# Patient Record
Sex: Female | Born: 1955 | Race: White | Hispanic: No | Marital: Married | Smoking: Never smoker
Health system: Southern US, Community
[De-identification: ages and names within clinical notes are randomized; demographics above are authoritative.]

## PROBLEM LIST (undated history)

## (undated) DIAGNOSIS — J302 Other seasonal allergic rhinitis: Secondary | ICD-10-CM

## (undated) DIAGNOSIS — T148XXA Other injury of unspecified body region, initial encounter: Secondary | ICD-10-CM

## (undated) DIAGNOSIS — I10 Essential (primary) hypertension: Secondary | ICD-10-CM

## (undated) DIAGNOSIS — F32A Depression, unspecified: Secondary | ICD-10-CM

## (undated) DIAGNOSIS — C50919 Malignant neoplasm of unspecified site of unspecified female breast: Secondary | ICD-10-CM

## (undated) DIAGNOSIS — Z9289 Personal history of other medical treatment: Secondary | ICD-10-CM

## (undated) DIAGNOSIS — R011 Cardiac murmur, unspecified: Secondary | ICD-10-CM

## (undated) DIAGNOSIS — L24A9 Irritant contact dermatitis due friction or contact with other specified body fluids: Secondary | ICD-10-CM

## (undated) DIAGNOSIS — I447 Left bundle-branch block, unspecified: Secondary | ICD-10-CM

## (undated) DIAGNOSIS — F329 Major depressive disorder, single episode, unspecified: Secondary | ICD-10-CM

## (undated) DIAGNOSIS — R0683 Snoring: Secondary | ICD-10-CM

## (undated) DIAGNOSIS — M1990 Unspecified osteoarthritis, unspecified site: Secondary | ICD-10-CM

## (undated) DIAGNOSIS — M797 Fibromyalgia: Secondary | ICD-10-CM

## (undated) DIAGNOSIS — E119 Type 2 diabetes mellitus without complications: Secondary | ICD-10-CM

## (undated) HISTORY — DX: Cardiac murmur, unspecified: R01.1

## (undated) HISTORY — PX: MOUTH SURGERY: SHX715

## (undated) HISTORY — PX: DILATION AND CURETTAGE OF UTERUS: SHX78

## (undated) HISTORY — DX: Left bundle-branch block, unspecified: I44.7

## (undated) HISTORY — PX: TONSILLECTOMY: SUR1361

## (undated) HISTORY — PX: ABDOMINAL HYSTERECTOMY: SHX81

---

## 2013-12-15 ENCOUNTER — Ambulatory Visit: Payer: Self-pay | Admitting: Endocrinology

## 2016-06-18 ENCOUNTER — Other Ambulatory Visit: Payer: Self-pay | Admitting: Surgical Oncology

## 2016-06-18 DIAGNOSIS — N6489 Other specified disorders of breast: Secondary | ICD-10-CM

## 2016-06-22 ENCOUNTER — Other Ambulatory Visit: Payer: Self-pay | Admitting: Surgical Oncology

## 2016-06-22 DIAGNOSIS — N6489 Other specified disorders of breast: Secondary | ICD-10-CM

## 2016-06-22 DIAGNOSIS — R599 Enlarged lymph nodes, unspecified: Secondary | ICD-10-CM

## 2016-06-25 ENCOUNTER — Inpatient Hospital Stay: Admission: RE | Admit: 2016-06-25 | Payer: Self-pay | Source: Ambulatory Visit

## 2016-06-25 ENCOUNTER — Ambulatory Visit
Admission: RE | Admit: 2016-06-25 | Discharge: 2016-06-25 | Disposition: A | Discharge status: Home / Self Care | Payer: BLUE CROSS/BLUE SHIELD | Source: Ambulatory Visit | Attending: Surgical Oncology | Admitting: Surgical Oncology

## 2016-06-25 DIAGNOSIS — N6489 Other specified disorders of breast: Secondary | ICD-10-CM

## 2016-06-25 DIAGNOSIS — R599 Enlarged lymph nodes, unspecified: Secondary | ICD-10-CM

## 2016-06-28 ENCOUNTER — Telehealth: Payer: Self-pay | Admitting: Hematology and Oncology

## 2016-06-28 ENCOUNTER — Encounter: Payer: Self-pay | Admitting: Hematology and Oncology

## 2016-06-28 NOTE — Telephone Encounter (Signed)
Appt scheduled with Dr. Lindi Adie on 9/25 at 815. Patient aware to arrive at 8am.Demogrpahics verfied. Letter mailed to the patient. Unable to give the patient the address.  I will have one of the ladies in HIM call and give her the address.

## 2016-06-28 NOTE — Telephone Encounter (Signed)
Pt called back. Gave the address to our facility.

## 2016-07-02 ENCOUNTER — Telehealth: Payer: Self-pay | Admitting: *Deleted

## 2016-07-02 ENCOUNTER — Ambulatory Visit (HOSPITAL_BASED_OUTPATIENT_CLINIC_OR_DEPARTMENT_OTHER): Payer: BLUE CROSS/BLUE SHIELD | Admitting: Hematology and Oncology

## 2016-07-02 ENCOUNTER — Encounter: Payer: Self-pay | Admitting: Hematology and Oncology

## 2016-07-02 ENCOUNTER — Encounter: Payer: Self-pay | Admitting: *Deleted

## 2016-07-02 ENCOUNTER — Other Ambulatory Visit: Payer: Self-pay | Admitting: Hematology and Oncology

## 2016-07-02 DIAGNOSIS — C50211 Malignant neoplasm of upper-inner quadrant of right female breast: Secondary | ICD-10-CM | POA: Diagnosis not present

## 2016-07-02 MED ORDER — ANASTROZOLE 1 MG PO TABS: 1.0000 mg | ORAL_TABLET | Freq: Every day | ORAL | 3 refills | Status: AC

## 2016-07-02 NOTE — Telephone Encounter (Signed)
Received order per Dr. Gudena for Mammaprint testing. Requisition sent to pathology and [facility]. Received by Keisha 

## 2016-07-02 NOTE — Progress Notes (Signed)
[facility] [place] NOTE  Patient Care Team: Verdell Carmine, MD as PCP - General (Family Medicine)  CHIEF COMPLAINTS/PURPOSE OF CONSULTATION:  Newly diagnosed breast cancer  HISTORY OF PRESENTING ILLNESS:  Sandra Compton 60 y.o. female is here because of recent diagnosis of right breast cancer. She had a routine screening mammogram that revealed an abnormality in the right breast measuring 1.5 cm in diameter. Original mammogram was done at [facility]. There was also a 3 cm right axillary lymph node. She subsequently underwent additional mammograms ultrasounds and a biopsy of both the breast mass in the lymph node. 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 The lymph node biopsy was also positive for breast cancer.   I reviewed her records extensively and collaborated the history with the patient.  SUMMARY OF ONCOLOGIC HISTORY:   Breast cancer of upper-inner quadrant of right female breast ([place])   06/25/2016 Initial Diagnosis    Right breast biopsy 7 cmfn: Invasive ductal carcinoma, ER 100%, PR 80%, Ki-67 50%, HER-2 negative ratio 1.07, right axillary lymph node biopsy positive for invasive ductal carcinoma, mammogram at [facility] revealed 1.5 cm mass to 3 cm axillary lymph node.       MEDICAL HISTORY:  Diabetes mellitus, hypertension  SURGICAL HISTORY: Hysterectomy, oral surgery, tonsillectomy  SOCIAL HISTORY: Denies any tobacco: Immigration drug use. 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  FAMILY HISTORY: Patient has family history of August and paternal grandmother with breast cancers  ALLERGIES:  has no allergies on file.  MEDICATIONS: triamterene hydrochlorothiazide 30 7. 5-25 milligrams, sertraline 100 mg daily, glyburide-metformin 5-500 milligrams daily  Current Outpatient Prescriptions  Medication Sig Dispense Refill  . anastrozole (ARIMIDEX)  1 MG tablet Take 1 tablet (1 mg total) by mouth daily. 90 tablet 3   No current facility-administered medications for this visit.     REVIEW OF SYSTEMS:   Constitutional: Denies fevers, chills or abnormal night sweats Eyes: Denies blurriness of vision, double vision or watery eyes Ears, nose, mouth, throat, and face: Denies mucositis or sore throat Respiratory: Denies cough, dyspnea or wheezes Cardiovascular: Denies palpitation, chest discomfort or lower extremity swelling Gastrointestinal:  Denies nausea, heartburn or change in bowel habits Skin: Denies abnormal skin rashes Lymphatics: Denies new lymphadenopathy or easy bruising Neurological:Denies numbness, tingling or new weaknesses Behavioral/Psych: Mood is stable, no new changes  Breast:  Denies any palpable lumps or discharge All other systems were reviewed with the patient and are negative.  PHYSICAL EXAMINATION: ECOG PERFORMANCE STATUS: 0 - Asymptomatic  Vitals:   07/02/16 0855  BP: (!) 159/77  Pulse: 86  Resp: 18  Temp: 98.8 F (37.1 C)   Filed Weights   07/02/16 0855  Weight: 186 lb 9.6 oz (84.6 kg)    GENERAL:alert, no distress and comfortable SKIN: skin color, texture, turgor are normal, no rashes or significant lesions EYES: normal, conjunctiva are pink and non-injected, sclera clear OROPHARYNX:no exudate, no erythema and lips, buccal mucosa, and tongue normal  NECK: supple, thyroid normal size, non-tender, without nodularity LYMPH:  no palpable lymphadenopathy in the cervical, axillary or inguinal LUNGS: clear to auscultation and percussion with normal breathing effort HEART: regular rate & rhythm and no murmurs and no lower extremity edema ABDOMEN:abdomen soft, non-tender and normal bowel sounds Musculoskeletal:no cyanosis of digits and no clubbing  PSYCH: alert & oriented x 3 with fluent speech NEURO: no focal motor/sensory deficits BREAST: No  palpable nodules in breast. No palpable axillary or  supraclavicular lymphadenopathy (exam performed in the presence of a chaperone)   RADIOGRAPHIC STUDIES: I have personally reviewed the radiological reports and agreed with the findings in the report.  ASSESSMENT AND PLAN:  Breast cancer of upper-inner quadrant of right female breast ([place]) Right breast biopsy 7 cmfn 06/25/2016: Invasive ductal carcinoma, ER 100%, PR 80%, Ki-67 50%, HER-2 negative ratio 1.07, right axillary lymph node biopsy positive for invasive ductal carcinoma, mammogram at [facility] revealed 1.5 cm mass to 3 cm axillary lymph node.  Pathology and radiology counseling: Discussed with the patient, the details of pathology including the type of breast cancer,the clinical staging, the significance of ER, PR and HER-2/neu receptors and the implications for treatment. After reviewing the pathology in detail, we proceeded to discuss the different treatment options between surgery, radiation, chemotherapy, antiestrogen therapies.  Recommendation: 1. Mammoprint testing on the biopsy to determine if she would benefit from systemic chemotherapy 2. start neoadjuvant therapy with anastrozole today 3. If the mammogram testing reveals high risk, we will then switch her from hormonal therapy to chemotherapy. 4. breast conserving surgery with sentinel lymph node evaluation 5. Adjuvant therapy based upon Mammaprint test results 6. Followed by adjuvant radiation 7. Followed by adjuvant antiestrogen therapy (with further consideration for PALLAS clinical trial)  Mammaprint counseling: MINDACT is a prospective, randomized phase III controlled trial that investigates the clinical utility of MammaPrint, when compared to standard clinical pathological criteria, with 6,693 patients enrolled from over 111 institutions. Clinical high-risk patients with a Low Risk MammaPrint result, including 48% node-positive, had 5-year distant metastasis-free survival rate in excess of 94 percent, whether randomized  to receive adjuvant chemotherapy or not proving MammaPrint's ability to safely identify Low Risk patients.  Anastrozole counseling: We discussed the risks and benefits of anti-estrogen therapy with aromatase inhibitors. These include but not limited to insomnia, hot flashes, mood changes, vaginal dryness, bone density loss, and weight gain. We strongly believe that the benefits far outweigh the risks. Patient understands these risks and consented to starting treatment.  We will present her case in the tumor board.   All questions were answered. The patient knows to call the clinic with any problems, questions or concerns.    Rulon Eisenmenger, MD 07/02/16

## 2016-07-02 NOTE — Assessment & Plan Note (Signed)
Right breast biopsy 7 cmfn 06/25/2016: Invasive ductal carcinoma, ER 100%, PR 80%, Ki-67 50%, HER-2 negative ratio 1.07, right axillary lymph node biopsy positive for invasive ductal carcinoma, mammogram at [facility] revealed 1.5 cm mass to 3 cm axillary lymph node.  Pathology and radiology counseling: Discussed with the patient, the details of pathology including the type of breast cancer,the clinical staging, the significance of ER, PR and HER-2/neu receptors and the implications for treatment. After reviewing the pathology in detail, we proceeded to discuss the different treatment options between surgery, radiation, chemotherapy, antiestrogen therapies.  Recommendation: 1. Mammoprint testing on the biopsy to determine if she would benefit from systemic chemotherapy 2. start neoadjuvant therapy with anastrozole today 3. If the mammogram testing reveals high risk, we will then switch her from hormonal therapy to chemotherapy. 4. breast conserving surgery with sentinel lymph node evaluation 5. Adjuvant therapy based upon Mammaprint test results 6. Followed by adjuvant radiation 7. Followed by adjuvant antiestrogen therapy (with further consideration for PALLAS clinical trial)  Anastrozole counseling: We discussed the risks and benefits of anti-estrogen therapy with aromatase inhibitors. These include but not limited to insomnia, hot flashes, mood changes, vaginal dryness, bone density loss, and weight gain. We strongly believe that the benefits far outweigh the risks. Patient understands these risks and consented to starting treatment.  We will present her case in the tumor board.

## 2016-07-09 ENCOUNTER — Encounter: Payer: Self-pay | Admitting: Radiation Oncology

## 2016-07-09 ENCOUNTER — Ambulatory Visit
Admission: RE | Admit: 2016-07-09 | Discharge: 2016-07-09 | Disposition: A | Discharge status: Home / Self Care | Payer: BLUE CROSS/BLUE SHIELD | Source: Ambulatory Visit | Attending: Radiation Oncology | Admitting: Radiation Oncology

## 2016-07-09 ENCOUNTER — Encounter (HOSPITAL_COMMUNITY): Payer: Self-pay

## 2016-07-09 ENCOUNTER — Ambulatory Visit (HOSPITAL_COMMUNITY)
Admission: RE | Admit: 2016-07-09 | Discharge: 2016-07-09 | Disposition: A | Discharge status: Home / Self Care | Payer: BLUE CROSS/BLUE SHIELD | Source: Ambulatory Visit | Attending: Hematology and Oncology | Admitting: Hematology and Oncology

## 2016-07-09 VITALS — BP 179/94 | HR 92 | Resp 16 | Ht 62.0 in | Wt 189.6 lb

## 2016-07-09 DIAGNOSIS — I10 Essential (primary) hypertension: Secondary | ICD-10-CM | POA: Diagnosis not present

## 2016-07-09 DIAGNOSIS — Z888 Allergy status to other drugs, medicaments and biological substances status: Secondary | ICD-10-CM | POA: Insufficient documentation

## 2016-07-09 DIAGNOSIS — C50211 Malignant neoplasm of upper-inner quadrant of right female breast: Secondary | ICD-10-CM | POA: Insufficient documentation

## 2016-07-09 DIAGNOSIS — C773 Secondary and unspecified malignant neoplasm of axilla and upper limb lymph nodes: Secondary | ICD-10-CM | POA: Insufficient documentation

## 2016-07-09 DIAGNOSIS — Z79899 Other long term (current) drug therapy: Secondary | ICD-10-CM | POA: Insufficient documentation

## 2016-07-09 DIAGNOSIS — Z880 Allergy status to penicillin: Secondary | ICD-10-CM | POA: Insufficient documentation

## 2016-07-09 DIAGNOSIS — Z803 Family history of malignant neoplasm of breast: Secondary | ICD-10-CM | POA: Diagnosis not present

## 2016-07-09 DIAGNOSIS — Z170 Estrogen receptor positive status [ER+]: Principal | ICD-10-CM

## 2016-07-09 DIAGNOSIS — E119 Type 2 diabetes mellitus without complications: Secondary | ICD-10-CM | POA: Insufficient documentation

## 2016-07-09 DIAGNOSIS — Z9889 Other specified postprocedural states: Secondary | ICD-10-CM | POA: Diagnosis not present

## 2016-07-09 HISTORY — DX: Essential (primary) hypertension: I10

## 2016-07-09 HISTORY — DX: Type 2 diabetes mellitus without complications: E11.9

## 2016-07-09 HISTORY — DX: Malignant neoplasm of unspecified site of unspecified female breast: C50.919

## 2016-07-09 LAB — POCT I-STAT CREATININE: CREATININE: 0.8 mg/dL (ref 0.44–1.00)

## 2016-07-09 MED ORDER — GADOBENATE DIMEGLUMINE 529 MG/ML IV SOLN
18.0000 mL | Freq: Once | INTRAVENOUS | Status: AC | PRN
  Administered 2016-07-09: 18 mL via INTRAVENOUS

## 2016-07-09 NOTE — Progress Notes (Signed)
Location of Breast Cancer: Upper inner quadrant of right breast  Histology per Pathology Report:    Receptor Status: ER(100%), PR (80%), Her2-neu (-), Ki-(50%)  Did patient present with symptoms (if so, please note symptoms) or was this found on screening mammography?: found on screening mammogram  Past/Anticipated interventions by surgeon, if LTE:IHDTPN conserving surgery with sentinel lymph node evaluation recommended  Past/Anticipated interventions by medical oncology, if any: Gudena recommending mammoprint testing on biopsy, started patient on neoadjuvant therapy with anastrozole, hormonal therapy would be switched to chemotherapy dependent upon mammoprint results then, adjuvant antiestrogen therapy  Lymphedema issues, if any:  no    Pain issues, if any: none    SAFETY ISSUES:  Prior radiation? no  Pacemaker/ICD? no  Possible current pregnancy?no  Is the patient on methotrexate? no  Current Complaints / other details:  60 year old female. Married. Follows up with Gudena on Monday for mammoprint results. Paternal aunt and paternal great aunt had breast ca.    Joaquim Lai, RN 07/09/2016,8:26 AM

## 2016-07-09 NOTE — Progress Notes (Signed)
Radiation Oncology         [phone] ________________________________  Initial Outpatient Consultation  Name: Sandra Compton MRN: [id number]  Date: 07/09/2016  DOB: 1956/10/02  CC:Pcp Not In System  Excell Seltzer, MD   REFERRING PHYSICIAN: Excell Seltzer, MD  DIAGNOSIS: 60 y.o. 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 - Clinical stage IIA    ICD-9-CM ICD-10-CM   1. Malignant neoplasm of upper-inner quadrant of right breast in female, estrogen receptor positive ([place]) 174.2 C50.211    V86.0 Z17.0     HISTORY OF PRESENT ILLNESS: Sandra Compton is a 60 y.o. female seen at the request of Dr. Lindi Adie for a new diagnosis of right breast cancer. The patient had a screening mammogram at [facility] on 06/04/2016 that revealed an abnormality in the right breast. She returned for further imaging at [facility] on 06/07/2016 where she was found to have a mass measuring 1.5 cm in diameter at 1:30 position. There was also a 3 cm right axillary lymph node. She subsequently underwent diagnostic imaging and a biopsy of both the breast mass in the lymph node. 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 The lymph node biopsy was also positive for breast cancer. Her biopsy has been submitted for mammoprint, and she was started on anastrazole. She has plans to also undergo lumpectomy with sentinel evaluation. Mammaprint reveals she is low risk. She comes today to discuss the role of radiotherapy following surgery, and after systemic treatment if indicated. Of note, history of breast cancer in paternal aunt, and paternal great-aunt.  Patient denies lymphedema issues, or pain at this time.  PREVIOUS RADIATION THERAPY: No  PAST MEDICAL HISTORY:  Past Medical History:  Diagnosis Date  . Breast cancer ([place])   . Diabetes mellitus without complication ([place])   . Hypertension        PAST SURGICAL HISTORY: Past Surgical History:  Procedure Laterality Date  . ABDOMINAL HYSTERECTOMY    . MOUTH SURGERY    . TONSILLECTOMY      FAMILY HISTORY:  Family History  Problem Relation Age of Onset  . Cancer Paternal Aunt     breast  . Cancer Other     breast    SOCIAL HISTORY:  Social History   Social History  . Marital status: Married    Spouse name: N/A  . Number of children: N/A  . Years of education: N/A   Occupational History  . Not on file.   Social History Main Topics  . Smoking status: Never Smoker  . Smokeless tobacco: Never Used  . Alcohol use No  . Drug use: No  . Sexual activity: Yes   Other Topics Concern  . Not on file   Social History Narrative  . No narrative on file    ALLERGIES: Codeine and Penicillins  MEDICATIONS:  Current Outpatient Prescriptions  Medication Sig Dispense Refill  . anastrozole (ARIMIDEX) 1 MG tablet Take 1 tablet (1 mg total) by mouth daily. 90 tablet 3  . dapagliflozin propanediol (FARXIGA) 5 MG TABS tablet Take 5 mg by mouth daily.    Marland Kitchen glyBURIDE-metformin (GLUCOVANCE) 5-500 MG tablet   0  . sertraline (ZOLOFT) 100 MG tablet   0  . triamterene-hydrochlorothiazide (MAXZIDE-25) 37.5-25 MG tablet   0   No current facility-administered medications for this encounter.     REVIEW OF SYSTEMS:  On review of systems, the patient  reports that she is doing well overall. She denies any chest pain, shortness of breath, cough, fevers, chills, night sweats, unintended weight changes. She denies any bowel or bladder disturbances, and denies abdominal pain, nausea or vomiting. She denies any new musculoskeletal or joint aches or pains. A complete review of systems is obtained and is otherwise negative.    PHYSICAL EXAM:  height is 5' 2"  (1.575 m) and weight is 189 lb 9.6 oz (86 kg). Her blood pressure is 179/94 (abnormal) and her pulse is 92. Her respiration is 16 and oxygen saturation is 100%.   Pain Scale 0/10 In  general this is a well appearing caucasian woman in no acute distress. She is alert and oriented x4 and appropriate throughout the examination. HEENT reveals that the patient is normocephalic, atraumatic. EOMs are intact. PERRLA. Skin is intact without any evidence of gross lesions. Cardiovascular exam reveals a regular rate and rhythm, no clicks rubs or murmurs are auscultated. Chest is clear to auscultation bilaterally. Lymphatic assessment is performed and does not reveal any adenopathy in the cervical, supraclavicular, axillary, or inguinal chains. Abdomen has active bowel sounds in all quadrants and is intact. The abdomen is soft, non tender, non distended. Lower extremities are negative for pretibial pitting edema, deep calf tenderness, cyanosis or clubbing.    KPS = 100  100 - Normal; no complaints; no evidence of disease. 90   - Able to carry on normal activity; minor signs or symptoms of disease. 80   - Normal activity with effort; some signs or symptoms of disease. 63   - Cares for self; unable to carry on normal activity or to do active work. 60   - Requires occasional assistance, but is able to care for most of his personal needs. 50   - Requires considerable assistance and frequent medical care. 64   - Disabled; requires special care and assistance. 69   - Severely disabled; hospital admission is indicated although death not imminent. 8   - Very sick; hospital admission necessary; active supportive treatment necessary. 10   - Moribund; fatal processes progressing rapidly. 0     - Dead  Karnofsky DA, Abelmann WH, Craver LS and Burchenal JH [phone]) The use of the nitrogen mustards in the palliative treatment of carcinoma: with particular reference to bronchogenic carcinoma Cancer 1 634-56  LABORATORY DATA:  No results found for: WBC, HGB, HCT, MCV, PLT No results found for: NA, K, CL, CO2 No results found for: ALT, AST, GGT, ALKPHOS, BILITOT   RADIOGRAPHY: Mm Digital Diagnostic  Unilat R  Result Date: 06/25/2016 CLINICAL DATA:  Confirmation clip placement after ultrasound-guided core needle biopsy of a suspicious 1.5 cm mass in the upper inner quadrant of the right breast and biopsy of a right axillary lymph node with focal cortical thickening. EXAM: DIAGNOSTIC RIGHT MAMMOGRAM POST ULTRASOUND BIOPSY COMPARISON:  Previous exam(s). FINDINGS: Mammographic images were obtained following ultrasound guided biopsy of a mass in the upper inner quadrant right breast and a right axillary lymph node. The ribbon shaped tissue marker clip migrated approximately 1 cm medial to the biopsied mass. The [facility] tissue marker clip placed at the site of the axillary node biopsy is not visible on the mammogram. Expected post biopsy changes are present without evidence of hematoma. IMPRESSION: Approximate 1 cm medial migration of the ribbon shaped tissue marker clip relative to the biopsied mass in the upper inner quadrant of the right breast. Final Assessment: Post Procedure Mammograms for Marker Placement Electronically Signed  By: Evangeline Dakin M.D.   On: 06/25/2016 12:50   [place] Rt Breast Bx W Loc Dev 1st Lesion Img Bx Spec US Guide  Addendum Date: 06/26/2016   ADDENDUM REPORT: 06/26/2016 13:21 ADDENDUM: Pathology revealed INVASIVE MAMMARY CARCINOMA, CARCINOMA IN SITU IS PRESENT of the upper inner quadrant of the Right breast. METASTATIC MAMMARY CARCINOMA of the Right axillary Level 1 lymph node. This was found to be concordant by Dr. Peggye Fothergill. Pathology results were discussed with the patient by telephone. The patient reported doing well after the biopsies with tenderness and bruising at the sites. Post biopsy instructions and care were reviewed and questions were answered. The patient was encouraged to call [facility] for any additional concerns. Surgical consultation for second opinion has been arranged with Dr. Adonis Housekeeper at [facility], per  patient request, on June 27, 2016. Pathology results reported by Terie Purser, RN on 06/26/2016. Electronically Signed   By: Evangeline Dakin M.D.   On: 06/26/2016 13:21   Result Date: 06/26/2016 CLINICAL DATA:  Screening detected approximate 1.5 cm mass associated with architectural distortion in the upper inner quadrant of the RIGHT breast. RIGHT axillary level 1 lymph node with focal cortical thickening. EXAM: ULTRASOUND GUIDED RIGHT BREAST CORE NEEDLE BIOPSY COMPARISON:  Previous exam(s). FINDINGS: I met with the patient and we discussed the procedure of ultrasound-guided biopsy, including benefits and alternatives. We discussed the high likelihood of a successful procedure. We discussed the risks of the procedure, including infection, bleeding, tissue injury, clip migration, and inadequate sampling. Informed written consent was given. The usual time-out protocol was performed immediately prior to the procedure. Using sterile technique with chlorhexidine as skin antisepsis, 1% Lidocaine as local anesthetic, under direct ultrasound visualization, a 12 gauge Bard core needle device was used to perform biopsy of the 1.5 cm mass in the upper inner right breast using a lateral approach. At the conclusion of the procedure a ribbon shaped tissue marker clip was deployed into the biopsy cavity. Follow up 2 view mammogram was performed and dictated separately. IMPRESSION: Ultrasound guided biopsy of a suspicious 1.5 cm mass in the upper inner quadrant of the right breast. No apparent complications. Electronically Signed: By: Evangeline Dakin M.D. On: 06/25/2016 12:51   [place] Rt Breast Bx W Loc Dev Ea Add Lesion Img Bx Spec US Guide  Addendum Date: 06/26/2016   ADDENDUM REPORT: 06/26/2016 14:06 ADDENDUM: Pathology revealed INVASIVE MAMMARY CARCINOMA, CARCINOMA IN SITU IS PRESENT of the upper inner quadrant of the Right breast. METASTATIC MAMMARY CARCINOMA of the Right axillary Level 1 lymph node. This was found to  be concordant by Dr. Peggye Fothergill. Pathology results were discussed with the patient by telephone. The patient reported doing well after the biopsies with tenderness and bruising at the sites. Post biopsy instructions and care were reviewed and questions were answered. The patient was encouraged to call [facility] for any additional concerns. Surgical consultation for second opinion has been arranged with Dr. Adonis Housekeeper at [facility], per patient request, on June 27, 2016. Pathology results reported by Terie Purser, RN on 06/26/2016. Electronically Signed   By: Evangeline Dakin M.D.   On: 06/26/2016 14:06   Result Date: 06/26/2016 CLINICAL DATA:  Screening detected approximate 1.5 cm mass associated with architectural distortion in the upper inner quadrant of the RIGHT breast. RIGHT axillary level 1 lymph node with focal cortical thickening. EXAM: ULTRASOUND GUIDED CORE NEEDLE BIOPSY OF A RIGHT  AXILLARY NODE COMPARISON:  Previous exam(s). FINDINGS: I met with the patient and we discussed the procedure of ultrasound-guided biopsy, including benefits and alternatives. We discussed the high likelihood of a successful procedure. We discussed the risks of the procedure, including infection, bleeding, tissue injury, clip migration, and inadequate sampling. Informed written consent was given. The usual time-out protocol was performed immediately prior to the procedure. Using sterile technique with chlorhexidine as skin antisepsis, 1% Lidocaine as local anesthetic, under direct ultrasound visualization, a 14 gauge Bard markedly core needle device was used to perform biopsy of the level 1 right axillary lymph node demonstrating focal cortical thickening using a lateral approach. At the conclusion of the procedure a spiral shaped HydroMARK tissue marker clip was deployed into the biopsy cavity. Follow up 2 view mammogram was performed and dictated separately. IMPRESSION:  Ultrasound guided biopsy of a level 1 right axillary lymph node demonstrating focal cortical thickening. No apparent complications. Electronically Signed: By: Evangeline Dakin M.D. On: 06/25/2016 12:16      IMPRESSION/PLAN: 1. 60 y.o. 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  We talked about radiation for early stage breast cancer. We will reserve final recommendations until after surgical treatment. The patient seems to be a good candidate for breast conservation. She would prefer to pursue radiation at [facility] with Dr. Orlene Erm  The above documentation reflects my direct findings during this shared patient visit. Please see the separate note by Dr. Tammi Klippel on this date for the remainder of the patient's plan of care.   Carola Rhine, PAC    This document serves as a record of services personally performed by Tyler Pita, MD. It was created on his behalf by Bethann Humble, a trained medical scribe. The creation of this record is based on the scribe's personal observations and the provider's statements to them. This document has been checked and approved by the attending provider.         Please see the note from Shona Simpson, PA-C from today's visit for more details of today's encounter.  I have personally performed a face to face diagnostic evaluation on this patient and devised the assessment and plan.  ------------------------------------------------   Tyler Pita, MD [place] Director and Director of Stereotactic Radiosurgery Direct Dial: [phone]  Fax: [phone] [email or web address]  Skype  LinkedIn

## 2016-07-09 NOTE — Progress Notes (Signed)
See progress note under physician encounter. 

## 2016-07-12 ENCOUNTER — Telehealth: Payer: Self-pay | Admitting: *Deleted

## 2016-07-12 NOTE — Telephone Encounter (Signed)
Received Mamammaprint Results of LOW RISK Informed pt of results and discussed we will r/s her appt with Dr. Lindi Adie to after her sx. Physician team notified.

## 2016-07-16 ENCOUNTER — Ambulatory Visit: Payer: BLUE CROSS/BLUE SHIELD | Admitting: Hematology and Oncology

## 2016-07-17 ENCOUNTER — Telehealth: Payer: Self-pay | Admitting: *Deleted

## 2016-07-17 NOTE — Telephone Encounter (Signed)
  Oncology Nurse Navigator Documentation  Navigator Location: [facility] (07/17/16 1500) Navigator Encounter Type: Telephone (07/17/16 1500) Telephone: Sandra Compton Call;Patient Update (07/17/16 1500)  Pt called to request update on sx date. Informed pt I have left msg with Dr. Excell Seltzer staff requesting update. Pt relate she is doing well and continues to take AI as directed.           Barriers/Navigation Needs: Coordination of Care (07/17/16 1500)   Interventions: Coordination of Care (07/17/16 1500)                      Time Spent with Patient: 30 (07/17/16 1500)

## 2016-07-19 ENCOUNTER — Other Ambulatory Visit: Payer: Self-pay | Admitting: General Surgery

## 2016-07-19 DIAGNOSIS — C50911 Malignant neoplasm of unspecified site of right female breast: Secondary | ICD-10-CM

## 2016-07-23 ENCOUNTER — Ambulatory Visit: Payer: Self-pay | Admitting: General Surgery

## 2016-08-07 ENCOUNTER — Other Ambulatory Visit: Payer: Self-pay | Admitting: General Surgery

## 2016-08-07 DIAGNOSIS — C50911 Malignant neoplasm of unspecified site of right female breast: Secondary | ICD-10-CM

## 2016-08-14 ENCOUNTER — Telehealth: Payer: Self-pay | Admitting: Hematology and Oncology

## 2016-08-14 NOTE — Telephone Encounter (Signed)
lvm to inform pt of 12/7 appt date/time per LOS

## 2016-08-22 ENCOUNTER — Telehealth: Payer: Self-pay | Admitting: Hematology and Oncology

## 2016-08-22 NOTE — Telephone Encounter (Signed)
12/7 Appointment rescheduled per patient request. The patient requested to have a late afternoon appointment scheduled on either a Wednesday, Thursday, or Friday per her work schedule.

## 2016-08-28 ENCOUNTER — Encounter (HOSPITAL_BASED_OUTPATIENT_CLINIC_OR_DEPARTMENT_OTHER)
Admission: RE | Admit: 2016-08-28 | Discharge: 2016-08-28 | Disposition: A | Discharge status: Home / Self Care | Payer: BC Managed Care – PPO | Source: Ambulatory Visit | Attending: General Surgery | Admitting: General Surgery

## 2016-08-28 ENCOUNTER — Encounter (HOSPITAL_BASED_OUTPATIENT_CLINIC_OR_DEPARTMENT_OTHER): Payer: Self-pay | Admitting: *Deleted

## 2016-08-28 DIAGNOSIS — I447 Left bundle-branch block, unspecified: Secondary | ICD-10-CM | POA: Diagnosis not present

## 2016-08-28 DIAGNOSIS — I350 Nonrheumatic aortic (valve) stenosis: Secondary | ICD-10-CM | POA: Diagnosis not present

## 2016-08-28 DIAGNOSIS — I253 Aneurysm of heart: Secondary | ICD-10-CM | POA: Insufficient documentation

## 2016-08-28 DIAGNOSIS — Z01810 Encounter for preprocedural cardiovascular examination: Secondary | ICD-10-CM | POA: Insufficient documentation

## 2016-08-28 DIAGNOSIS — I10 Essential (primary) hypertension: Secondary | ICD-10-CM | POA: Insufficient documentation

## 2016-08-28 DIAGNOSIS — R011 Cardiac murmur, unspecified: Secondary | ICD-10-CM | POA: Diagnosis not present

## 2016-08-28 LAB — BASIC METABOLIC PANEL
ANION GAP: 11 (ref 5–15)
BUN: 11 mg/dL (ref 6–20)
CHLORIDE: 99 mmol/L — AB (ref 101–111)
CO2: 26 mmol/L (ref 22–32)
Calcium: 9.2 mg/dL (ref 8.9–10.3)
Creatinine, Ser: 0.77 mg/dL (ref 0.44–1.00)
GFR calc non Af Amer: >60 mL/min (ref >60)
Glucose, Bld: 274 mg/dL — ABNORMAL HIGH (ref 65–99)
POTASSIUM: 4 mmol/L (ref 3.5–5.1)
SODIUM: 136 mmol/L (ref 135–145)

## 2016-08-28 NOTE — Progress Notes (Signed)
Pt in for PAT appt, EKG compared to one from 2008 and showed new left BBB. Pt to see cardioogy prior to surgery.  Dr Lissa Hoard in to speak with pt at length, and [facility] surgery scheduler at [facility] aware of development, she will arrange cardiology appt and let pt know.

## 2016-08-29 ENCOUNTER — Encounter: Payer: Self-pay | Admitting: Nurse Practitioner

## 2016-08-29 ENCOUNTER — Ambulatory Visit (INDEPENDENT_AMBULATORY_CARE_PROVIDER_SITE_OTHER): Payer: BC Managed Care – PPO | Admitting: Nurse Practitioner

## 2016-08-29 VITALS — BP 164/72 | HR 91 | Ht 62.0 in | Wt 189.2 lb

## 2016-08-29 DIAGNOSIS — I10 Essential (primary) hypertension: Secondary | ICD-10-CM | POA: Insufficient documentation

## 2016-08-29 DIAGNOSIS — Z01818 Encounter for other preprocedural examination: Secondary | ICD-10-CM | POA: Diagnosis not present

## 2016-08-29 DIAGNOSIS — E119 Type 2 diabetes mellitus without complications: Secondary | ICD-10-CM | POA: Insufficient documentation

## 2016-08-29 DIAGNOSIS — I447 Left bundle-branch block, unspecified: Secondary | ICD-10-CM

## 2016-08-29 DIAGNOSIS — R011 Cardiac murmur, unspecified: Secondary | ICD-10-CM | POA: Diagnosis not present

## 2016-08-29 DIAGNOSIS — C50919 Malignant neoplasm of unspecified site of unspecified female breast: Secondary | ICD-10-CM | POA: Insufficient documentation

## 2016-08-29 MED ORDER — CARVEDILOL 3.125 MG PO TABS: 3.1250 mg | ORAL_TABLET | Freq: Two times a day (BID) | ORAL | 6 refills | Status: AC

## 2016-08-29 NOTE — Progress Notes (Signed)
Cardiology Clinic Note   Patient Name: Sandra Compton Date of Encounter: 08/29/2016  Primary Care Provider:  Pcp Not In System Primary Cardiologist:  New - C. Hilty, MD   Patient Profile    60 y/o ? w/ a h/o HTN, DM, depression, and fibromyalgia, who presents for cardiac evaluation related to recent dx of R breast cancer pending lumpectomy with finding of LBBB during preop eval.  Past Medical History    Past Medical History:  Diagnosis Date  . Arthritis   . Breast cancer ([place])    a. 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  . Depression   . Diabetes mellitus without complication ([place])    a. Dx ~ 2004.  . Fibromyalgia   . Hypertension    a. > 20 yr hx.  Marland Kitchen LBBB (left bundle branch block)    a. Dx 08/2016.  . Seasonal allergies   . Snores   . Systolic murmur    Past Surgical History:  Procedure Laterality Date  . ABDOMINAL HYSTERECTOMY    . DILATION AND CURETTAGE OF UTERUS    . MOUTH SURGERY    . TONSILLECTOMY      Allergies  Allergies  Allergen Reactions  . Codeine Other (See Comments)    Dizzy  . Penicillins Rash    History of Present Illness    60 y/o ? with a prior h/o HTN, DM, depression, and fibromyalgia.  She has no prior cardiac hx.  She has estimates that she has been on medicine for HTN for > 20 yrs and DM for ~ 13 yrs.  She has never been on a statin and says that she doesn't think she would take one if offered due to concern over potential side effects. 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 She is relatively active, though doesn't necessarily exercise.    She had an abnl mammogram earlier this year, leading to u/s and subsequent bx of the right breast, revealing invasice ductal carcinoma of the breast and metastatic mammary carcinoma in the axillary lymph node.  She has been  evaluated @ [facility] [place] [facility] and also oncology with plan for seed implant (scheduled for 11/27), lumpectomy (scheduled for 1/29), and radiation.  She underwent preop eval on 11/21 and was found to have a LBBB.  Due to this new finding, she was referred for cardiology evaluation today.  With her h/o fibromyalgia, she experiences daily, intermittent, diffuse body aches and/or tenderness.  Sometimes this may include chest pain that lasts up to 30 mins and resolves spontaneously.  She has also had c/p that resolves with belching.  She denies any h/o exertional c/p or significant DOE or recent change to exercise tolerance.  Further, she denies palpitations, pnd, orthopnea, n, v, dizziness, syncope, edema, weight gain, or early satiety.  Her BP has been more elevated than usual, often in the 150's to 160's @ home, ever since her cancer dx.  Home Medications    Prior to Admission medications   Medication Sig Start Date End Date Taking? Authorizing Provider  anastrozole (ARIMIDEX) 1 MG tablet Take 1 tablet (1 mg total) by mouth daily. 07/02/16   Nicholas Lose, MD  BIOGAIA PROBIOTIC (BIOGAIA PROBIOTIC) LIQD Take by mouth daily at 8 pm.    Historical Provider, MD  dapagliflozin propanediol (FARXIGA) 5 MG TABS tablet Take 5 mg  by mouth daily.    Historical Provider, MD  glyBURIDE-metformin (GLUCOVANCE) 5-500 MG tablet  05/09/16   Historical Provider, MD  sertraline (ZOLOFT) 100 MG tablet  04/12/16   Historical Provider, MD  triamterene-hydrochlorothiazide (MAXZIDE-25) 37.5-25 MG tablet  04/12/16   Historical Provider, MD    Family History    Family History  Problem Relation Age of Onset  . Cancer Paternal Aunt     breast  . Cancer Other     breast  . Heart failure Mother     died suddenly @ 47.  . Stroke Father     died @ 47. ? h/o silent MI.  Marland Kitchen Hypertension Father   . Diabetes Brother   . Hypertension Brother   . Brain cancer Brother   . Hypertension Brother     Social History    Social  History   Social History  . Marital status: Married    Spouse name: N/A  . Number of children: N/A  . Years of education: N/A   Occupational History  . Teacher     Works as Conservation officer, nature and Advice worker @ [employer]  . Smoking status: Never Smoker  . Smokeless tobacco: Never Used  . Alcohol use No  . Drug use: No  . Sexual activity: Yes   Other Topics Concern  . Not on file   Social History Narrative   Lives in [place] with her husband. She does not routinely exercise.     Review of Systems    General:  No chills, fever, night sweats or weight changes.  Psych: She has been more anxious since her breast cancer dx. Cardiovascular:  No chest pain, dyspnea on exertion, edema, orthopnea, palpitations, paroxysmal nocturnal dyspnea. Dermatological: No rash, lesions/masses Respiratory: No cough, dyspnea Urologic: No hematuria, dysuria Abdominal:   No nausea, vomiting, diarrhea, bright red blood per rectum, melena, or hematemesis Neurologic:  No visual changes, wkns, changes in mental status. All other systems reviewed and are otherwise negative except as noted above.  Physical Exam    VS:  BP (!) 164/72   Pulse 91   Ht 5\' 2"  (1.575 m)   Wt 189 lb 3.2 oz (85.8 kg)   LMP  (LMP Unknown)   SpO2 98%   BMI 34.61 kg/m  , BMI Body mass index is 34.61 kg/m. GEN: Well nourished, well developed, in no acute distress.  HEENT: normal.  Neck: Supple, no JVD, carotid bruits, or masses. Cardiac: RRR, 2/6 SEM @ RUSB, no rubs, or gallops. No clubbing, cyanosis, edema.  Radials/DP/PT 2+ and equal bilaterally.  Respiratory:  Respirations regular and unlabored, clear to auscultation bilaterally. GI: Soft, nontender, nondistended, BS + x 4. MS: no deformity or atrophy. Skin: warm and dry, no rash. Neuro:  Strength and sensation are intact. Psych: Normal affect.  Accessory Clinical Findings    ECG - RSR, 93, LBBB,  LAE.  Assessment & Plan   1.  LBBB/Preoperative cardiac evaluation:  Pt was dx with right breast cancer in Sept and is now pending seed implantation and lumpectomy next week.  As part of preop eval, an ECG was performed and showed LBBB, which was not previously dx.  She does have a h/o intermittent c/p and chest tenderness in the setting of fibromyalgia, but denies any prior h/o exertional c/p, DOE, or limitations in exercise tolerance.  I have discussed her case with Dr. Debara Pickett today.  Given new LBBB, I will arrange for an  exercise myoview asap, for preoperative risk stratification.  If this study is low risk/non-ischemic, she will be able to proceed with surgery as planned.  If however, this is a high risk study, she will require further cardiac eval prior to breast surgery.  2.  Systolic murmur:  She has a 2/6 SEM @ the upper sternal borders on exam.  She says that she has been periodically told that she has a murmur and other times told that she does not.  She does not have any exertional c/p or dyspnea.  Will obtain an echo.  3.  Essential HTN:  BP is elevated @ 164/72.  I repeated this and she was 180/90.  She says that since her cancer dx, her bp @ home has typically been in the 150's - 160's.  Given pending surgery, I will add  blocker therapy to her regimen (coreg 3.125 bid).  Cont Maxzide.    4.  Depression:  On zoloft @ home.  5.  DMII:  On glyburide/metformin.  Not on statin and isn't interested in considering @ this time.  6.  Disposition:  F/u echo and myoview early next week.  Further recs pending results.  Murray Hodgkins, NP 08/29/2016, 12:40 PM

## 2016-08-29 NOTE — Addendum Note (Signed)
Addended by: Vennie Homans on: 08/29/2016 03:21 PM   Modules accepted: Orders

## 2016-08-29 NOTE — Patient Instructions (Addendum)
Medication Instructions:  START- Carvedilol 3.125 mg twice a day  Labwork: None Ordered  Testing/Procedures: Your physician has requested that you have an echocardiogram at [facility]. Echocardiography is a painless test that uses sound waves to create images of your heart. It provides your doctor with information about the size and shape of your heart and how well your heart's chambers and valves are working. This procedure takes approximately one hour. There are no restrictions for this procedure.  Your physician has requested that you have en exercise stress myoview at [facility]. For further information please visit [email or web address]. Please follow instruction sheet, as given.   Follow-Up: Your physician recommends that you schedule a follow-up appointment in: As Needed   Any Other Special Instructions Will Be Listed Below (If Applicable).        Happy Thanksgiving  If you need a refill on your cardiac medications before your next appointment, please call your pharmacy.

## 2016-08-31 ENCOUNTER — Encounter (HOSPITAL_BASED_OUTPATIENT_CLINIC_OR_DEPARTMENT_OTHER)
Admission: RE | Admit: 2016-08-31 | Discharge: 2016-08-31 | Disposition: A | Discharge status: Home / Self Care | Payer: BC Managed Care – PPO | Source: Ambulatory Visit | Attending: Nurse Practitioner | Admitting: Nurse Practitioner

## 2016-08-31 ENCOUNTER — Ambulatory Visit (HOSPITAL_COMMUNITY)
Admission: RE | Admit: 2016-08-31 | Discharge: 2016-08-31 | Disposition: A | Discharge status: Home / Self Care | Payer: BC Managed Care – PPO | Source: Ambulatory Visit | Attending: Nurse Practitioner | Admitting: Nurse Practitioner

## 2016-08-31 ENCOUNTER — Telehealth: Payer: Self-pay | Admitting: Nurse Practitioner

## 2016-08-31 DIAGNOSIS — R011 Cardiac murmur, unspecified: Secondary | ICD-10-CM

## 2016-08-31 DIAGNOSIS — I10 Essential (primary) hypertension: Secondary | ICD-10-CM | POA: Diagnosis not present

## 2016-08-31 DIAGNOSIS — Z01818 Encounter for other preprocedural examination: Secondary | ICD-10-CM | POA: Diagnosis not present

## 2016-08-31 DIAGNOSIS — I447 Left bundle-branch block, unspecified: Secondary | ICD-10-CM

## 2016-08-31 DIAGNOSIS — Z01810 Encounter for preprocedural cardiovascular examination: Secondary | ICD-10-CM | POA: Diagnosis not present

## 2016-08-31 LAB — NM MYOCAR MULTI W/SPECT W/WALL MOTION / EF
CHL CUP RESTING HR STRESS: 107 {beats}/min
CSEPED: 0 min
CSEPEDS: 0 s
CSEPHR: 85 %
Estimated workload: 1 METS
MPHR: 159 {beats}/min
Peak HR: 136 {beats}/min

## 2016-08-31 LAB — ECHOCARDIOGRAM COMPLETE
FS: 36 % (ref 28–44)
IVS/LV PW RATIO, ED: 0.89
LA ID, A-P, ES: 34 mm
LA diam index: 1.82 cm/m2
LA vol A4C: 53.8 ml
LA vol: 53.2 mL
LAVOLIN: 28.4 mL/m2
LDCA: 2.84 cm2
LEFT ATRIUM END SYS DIAM: 34 mm
LV PW d: 11.4 mm — AB (ref 0.6–1.1)
LVOTD: 19 mm
RV LATERAL S' VELOCITY: 12.3 cm/s
RV TAPSE: 18.4 mm

## 2016-08-31 MED ORDER — REGADENOSON 0.4 MG/5ML IV SOLN
0.4000 mg | Freq: Once | INTRAVENOUS | Status: AC
  Administered 2016-08-31: 0.4 mg via INTRAVENOUS

## 2016-08-31 MED ORDER — TECHNETIUM TC 99M TETROFOSMIN IV KIT
30.0000 | PACK | Freq: Once | INTRAVENOUS | Status: AC | PRN
  Administered 2016-08-31: 30 via INTRAVENOUS

## 2016-08-31 MED ORDER — TECHNETIUM TC 99M TETROFOSMIN IV KIT
10.0000 | PACK | Freq: Once | INTRAVENOUS | Status: AC | PRN
  Administered 2016-08-31: 10 via INTRAVENOUS

## 2016-08-31 MED ORDER — AMINOPHYLLINE 25 MG/ML IV SOLN
INTRAVENOUS | Status: AC
  Filled 2016-08-31: qty 10

## 2016-08-31 MED ORDER — REGADENOSON 0.4 MG/5ML IV SOLN
INTRAVENOUS | Status: AC
  Filled 2016-08-31: qty 5

## 2016-08-31 NOTE — Telephone Encounter (Signed)
   Study results below (echo/lexiscan MV) reviewed with Dr. Debara Pickett who evaluated stress testing static images independently.  Image quality was overall poor, though stress imaging was of better quality than rest imaging.  ? Of inferolateral wall ischemia described by radiology, is felt most likely to be artifact.  EF nl by echo and nuclear study (no rwma on echo).  Sandra Compton is felt to be @ low risk for a cardiac complication with pending right breast lumpectomy.  I have notified Sandra Compton of the above results and recommended continuation of  blocker therapy throughout the perioperative period.  She verbalized understanding and was grateful for the call back.  2D Echocardiogram 11.24.2017  Study Conclusions  - Left ventricle: The cavity size was normal. Wall thickness was   increased in a pattern of mild LVH. Systolic function was normal.   The estimated ejection fraction was in the range of 50% to 55%.   Incoordinate septal motion. Doppler parameters are consistent   with abnormal left ventricular relaxation (grade 1 diastolic   dysfunction). The E/e&' ratio is <8, suggesting normal LV filling   pressure. - Aortic valve: Trileaflet. Sclerosis without stenosis. There was   no regurgitation. - Mitral valve: Calcified annulus. Mildly thickened leaflets . - Left atrium: The atrium was normal in size. - Right atrium: The atrium was normal in size. - Atrial septum: Atrial septal aneurysm. Cannot exclude a PFO. - Inferior vena cava: The vessel was normal in size. The   respirophasic diameter changes were in the normal range (>= 50%),   consistent with normal central venous pressure. _____________   Wille Glaser 11.24.2017  IMPRESSION: 1. Heterogeneous myocardial activity. Cannot exclude ischemia within the inferior lateral wall.  2. Normal left ventricular wall motion.  3. Left ventricular ejection fraction 58%  4. Non invasive risk stratification*: Potentially  intermediate Caller verbalized understanding and was grateful for the call back.  Murray Hodgkins, NP 08/31/2016, 6:57 PM

## 2016-08-31 NOTE — Progress Notes (Signed)
  Echocardiogram 2D Echocardiogram has been performed.  Sandra Compton 08/31/2016, 1:55 PM

## 2016-09-03 ENCOUNTER — Ambulatory Visit
Admission: RE | Admit: 2016-09-03 | Discharge: 2016-09-03 | Disposition: A | Discharge status: Home / Self Care | Payer: BC Managed Care – PPO | Source: Ambulatory Visit | Attending: General Surgery | Admitting: General Surgery

## 2016-09-03 ENCOUNTER — Other Ambulatory Visit: Payer: Self-pay | Admitting: General Surgery

## 2016-09-03 DIAGNOSIS — C50911 Malignant neoplasm of unspecified site of right female breast: Secondary | ICD-10-CM

## 2016-09-04 ENCOUNTER — Encounter (HOSPITAL_COMMUNITY): Payer: BC Managed Care – PPO

## 2016-09-04 NOTE — H&P (Signed)
History of Present Illness The patient is a 60 year old female who presents with breast cancer. She is a post menopausal female referred by Dr. Peggye Fothergill for evaluation of recently diagnosed carcinoma of the right breast. She recently presented for a screening mamogram revealing a possible right breast mass.. Subsequent imaging performed at [place] in [facility] included diagnostic mamogram showing an irregular mass with architectural distortion in the upper inner right breast measuring 1.5 cm. and ultrasound showing an irregular hypoechoic mass at the 1:30 position of the right breast 7 cm from the nipple measuring 1.5 cm in maximum diameter. Axillary ultrasound also demonstrated an abnormal axillary lymph node measuring up to 3 cm. She subsequently was referred to the breast center here for biopsy. An ultrasound guided breast biopsy and biopsy of the axillary lymph node was performed on June 25, 2016 with pathology revealing invasive ductal carcinoma of the breast and metastatic mammary carcinoma in the axillary lymph node. She is seen now in the office for initial treatment planning. Of note is post biopsy mammogram showed that the ribbon clip had migrated approximately 1 cm medial to the biopsied mass. A hydro-mark clip was placed in the lymph node. She has experienced no breast symptoms, specifically lump or pain, nipple discharge or skin changes. She does not have a personal history of any previous breast problems.  Findings at that time were the following: Tumor size: 1.5 cm Tumor grade: Not reported Estrogen Receptor: Pending Progesterone Receptor: Pending Her-2 neu: Pending Lymph node status: Positive    Other Problems  Breast Cancer Diabetes Mellitus High blood pressure Lump In Breast Transfusion history  Past Surgical History Breast Biopsy Right. Hysterectomy (not due to cancer) - Complete Oral  Surgery Tonsillectomy  Diagnostic Studies History  Colonoscopy never Mammogram within last year Pap Smear >5 years ago  Allergies ( No Known Drug Allergies  Medication History  Triamterene-HCTZ (37.5-25MG Tablet, Oral) Active. Sertraline HCl (100MG Tablet, Oral) Active. GlyBURIDE-MetFORMIN (5-500MG Tablet, Oral) Active. Medications Reconciled  Social History Caffeine use Carbonated beverages, Coffee, Tea. No alcohol use No drug use Tobacco use Never smoker.  Family History  Arthritis Father. Cancer Brother. Cerebrovascular Accident Father. Colon Polyps Brother. Hypertension Father. Ischemic Bowel Disease Father. Melanoma Family Members In General. Seizure disorder Mother.  Pregnancy / Birth History  Age at menarche 60 years. Age of menopause >67 Gravida 5 Irregular periods Length (months) of breastfeeding 3-6 Maternal age 5-25 Para 4    Review of Systems General Not Present- Appetite Loss, Chills, Fatigue, Fever, Night Sweats, Weight Gain and Weight Loss. Skin Not Present- Change in Wart/Mole, Dryness, Hives, Jaundice, New Lesions, Non-Healing Wounds, Rash and Ulcer. HEENT Present- Seasonal Allergies. Not Present- Earache, Hearing Loss, Hoarseness, Nose Bleed, Oral Ulcers, Ringing in the Ears, Sinus Pain, Sore Throat, Visual Disturbances, Wears glasses/contact lenses and Yellow Eyes. Respiratory Present- Snoring. Not Present- Bloody sputum, Chronic Cough, Difficulty Breathing and Wheezing. Breast Present- Breast Mass. Not Present- Breast Pain, Nipple Discharge and Skin Changes. Cardiovascular Not Present- Chest Pain, Difficulty Breathing Lying Down, Leg Cramps, Palpitations, Rapid Heart Rate, Shortness of Breath and Swelling of Extremities. Gastrointestinal Present- Excessive gas. Not Present- Abdominal Pain, Bloating, Bloody Stool, Change in Bowel Habits, Chronic diarrhea, Constipation, Difficulty Swallowing, Gets full quickly at  meals, Hemorrhoids, Indigestion, Nausea, Rectal Pain and Vomiting. Female Genitourinary Present- Urgency. Not Present- Frequency, Nocturia, Painful Urination and Pelvic Pain. Musculoskeletal Present- Muscle Pain. Not Present- Back Pain, Joint Pain, Joint Stiffness, Muscle Weakness and Swelling of  Extremities. Neurological Not Present- Decreased Memory, Fainting, Headaches, Numbness, Seizures, Tingling, Tremor, Trouble walking and Weakness. Psychiatric Not Present- Anxiety, Bipolar, Change in Sleep Pattern, Depression, Fearful and Frequent crying. Endocrine Present- Hot flashes. Not Present- Cold Intolerance, Excessive Hunger, Hair Changes, Heat Intolerance and New Diabetes. Hematology Not Present- Blood Thinners, Easy Bruising, Excessive bleeding, Gland problems, HIV and Persistent Infections.  Vitals   Weight: 189 lb Height: 62in Body Surface Area: 1.87 m Body Mass Index: 34.57 kg/m  Temp.: 97.91F(Temporal)  Pulse: 103 (Regular)  BP: 130/70 (Sitting, Left Arm, Standard)       Physical Exam ( The physical exam findings are as follows: Note:General: Alert, moderately obese Caucasian female, in no distress Skin: Warm and dry without rash or infection. HEENT: No palpable masses or thyromegaly. Sclera nonicteric. Lymph nodes: No cervical, supraclavicular, nodes palpable. Breasts: Bruising up her right breast post biopsy. There is a subtle mass palpable at about 12 o'clock position of the breast but this could just be postbiopsy hematoma. No other masses in either breast. I cannot feel any axillary adenopathy. Lungs: Breath sounds clear and equal. No wheezing or increased work of breathing. Cardiovascular: Regular rate and rhythm without murmer. No JVD or edema. Abdomen: Nondistended. Soft and nontender. No masses palpable. No organomegaly. No palpable hernias. Extremities: No edema or joint swelling or deformity. No chronic venous stasis changes. Neurologic: Alert and fully  oriented. Gait normal. No focal weakness. Psychiatric: Normal mood and affect. Thought content appropriate with normal judgement and insight    Assessment & Plan Marland Kitchen T. Johnanthony Wilden MD; 06/27/2016 4:45 PM) STAGE II BREAST CANCER, RIGHT (C50.911) Impression: 60 year old female with a new diagnosis of cancer of the right breast, upper inner quadrant. Clinical stage IIA. Prognostic panel has shown an ER/PR HER-2 negative tumor. She had a low risk manner print score and therefore neoadjuvant chemotherapy was not recommended. After discussion we have therefore decided to proceed with radioactive seed localized lumpectomy and radioactive seed targeted axillary lymph node biopsy/dissection. We discussed options of breast conservation with lumpectomy or total mastectomy and sentinal lymph node biopsy/dissection. We discussed the nature of the surgery and risks of anesthetic complications, bleeding, infection, lymphedema and possible need for further surgery based on final margins.   Current Plans Radioactive seed localized right breast lumpectomy and radioactive seed localized targeted right axillary sentinel lymph node biopsy/dissection under general anesthesia as an outpatient

## 2016-09-05 ENCOUNTER — Ambulatory Visit (HOSPITAL_BASED_OUTPATIENT_CLINIC_OR_DEPARTMENT_OTHER)
Admission: RE | Admit: 2016-09-05 | Discharge: 2016-09-06 | Disposition: A | Discharge status: Home / Self Care | Payer: BC Managed Care – PPO | Source: Ambulatory Visit | Attending: General Surgery | Admitting: General Surgery

## 2016-09-05 ENCOUNTER — Encounter (HOSPITAL_COMMUNITY)
Admission: RE | Admit: 2016-09-05 | Discharge: 2016-09-05 | Disposition: A | Discharge status: Home / Self Care | Payer: BC Managed Care – PPO | Source: Ambulatory Visit | Attending: General Surgery | Admitting: General Surgery

## 2016-09-05 ENCOUNTER — Ambulatory Visit (HOSPITAL_BASED_OUTPATIENT_CLINIC_OR_DEPARTMENT_OTHER): Payer: BC Managed Care – PPO | Admitting: Anesthesiology

## 2016-09-05 ENCOUNTER — Encounter (HOSPITAL_BASED_OUTPATIENT_CLINIC_OR_DEPARTMENT_OTHER): Payer: Self-pay | Admitting: Certified Registered"

## 2016-09-05 ENCOUNTER — Encounter (HOSPITAL_BASED_OUTPATIENT_CLINIC_OR_DEPARTMENT_OTHER)
Admission: RE | Disposition: A | Discharge status: Home / Self Care | Payer: Self-pay | Source: Ambulatory Visit | Attending: General Surgery

## 2016-09-05 ENCOUNTER — Ambulatory Visit
Admission: RE | Admit: 2016-09-05 | Discharge: 2016-09-05 | Disposition: A | Discharge status: Home / Self Care | Payer: BC Managed Care – PPO | Source: Ambulatory Visit | Attending: General Surgery | Admitting: General Surgery

## 2016-09-05 DIAGNOSIS — Z79899 Other long term (current) drug therapy: Secondary | ICD-10-CM | POA: Diagnosis not present

## 2016-09-05 DIAGNOSIS — Z8249 Family history of ischemic heart disease and other diseases of the circulatory system: Secondary | ICD-10-CM | POA: Insufficient documentation

## 2016-09-05 DIAGNOSIS — Z90710 Acquired absence of both cervix and uterus: Secondary | ICD-10-CM | POA: Diagnosis not present

## 2016-09-05 DIAGNOSIS — C50911 Malignant neoplasm of unspecified site of right female breast: Secondary | ICD-10-CM

## 2016-09-05 DIAGNOSIS — Z8261 Family history of arthritis: Secondary | ICD-10-CM | POA: Insufficient documentation

## 2016-09-05 DIAGNOSIS — Z8371 Family history of colonic polyps: Secondary | ICD-10-CM | POA: Insufficient documentation

## 2016-09-05 DIAGNOSIS — C50211 Malignant neoplasm of upper-inner quadrant of right female breast: Secondary | ICD-10-CM | POA: Insufficient documentation

## 2016-09-05 DIAGNOSIS — Z809 Family history of malignant neoplasm, unspecified: Secondary | ICD-10-CM | POA: Diagnosis not present

## 2016-09-05 DIAGNOSIS — Z823 Family history of stroke: Secondary | ICD-10-CM | POA: Diagnosis not present

## 2016-09-05 DIAGNOSIS — Z820 Family history of epilepsy and other diseases of the nervous system: Secondary | ICD-10-CM | POA: Diagnosis not present

## 2016-09-05 DIAGNOSIS — E119 Type 2 diabetes mellitus without complications: Secondary | ICD-10-CM | POA: Diagnosis not present

## 2016-09-05 DIAGNOSIS — Z170 Estrogen receptor positive status [ER+]: Secondary | ICD-10-CM | POA: Diagnosis not present

## 2016-09-05 DIAGNOSIS — M797 Fibromyalgia: Secondary | ICD-10-CM | POA: Insufficient documentation

## 2016-09-05 DIAGNOSIS — I10 Essential (primary) hypertension: Secondary | ICD-10-CM | POA: Insufficient documentation

## 2016-09-05 DIAGNOSIS — M1990 Unspecified osteoarthritis, unspecified site: Secondary | ICD-10-CM | POA: Insufficient documentation

## 2016-09-05 DIAGNOSIS — C773 Secondary and unspecified malignant neoplasm of axilla and upper limb lymph nodes: Secondary | ICD-10-CM | POA: Diagnosis not present

## 2016-09-05 HISTORY — DX: Snoring: R06.83

## 2016-09-05 HISTORY — DX: Fibromyalgia: M79.7

## 2016-09-05 HISTORY — DX: Major depressive disorder, single episode, unspecified: F32.9

## 2016-09-05 HISTORY — DX: Unspecified osteoarthritis, unspecified site: M19.90

## 2016-09-05 HISTORY — DX: Depression, unspecified: F32.A

## 2016-09-05 HISTORY — DX: Other seasonal allergic rhinitis: J30.2

## 2016-09-05 HISTORY — PX: RADIOACTIVE SEED GUIDED PARTIAL MASTECTOMY/AXILLARY SENTINEL NODE BIOPSY/AXILLARY NODE DISSECTION: SHX6491

## 2016-09-05 LAB — GLUCOSE, CAPILLARY
GLUCOSE-CAPILLARY: 157 mg/dL — AB (ref 65–99)
Glucose-Capillary: 163 mg/dL — ABNORMAL HIGH (ref 65–99)
Glucose-Capillary: 233 mg/dL — ABNORMAL HIGH (ref 65–99)
Glucose-Capillary: 259 mg/dL — ABNORMAL HIGH (ref 65–99)
Glucose-Capillary: 259 mg/dL — ABNORMAL HIGH (ref 65–99)

## 2016-09-05 SURGERY — RADIOACTIVE SEED GUIDED PARTIAL MASTECTOMY WITH AXILLARY SENTINEL LYMPH NODE BIOPSY AND AXILLARY LYMPH NODE DISSECTION
Anesthesia: Regional | Site: Breast | Laterality: Right

## 2016-09-05 MED ORDER — SCOPOLAMINE 1 MG/3DAYS TD PT72: 1.0000 | MEDICATED_PATCH | Freq: Once | TRANSDERMAL | Status: DC | PRN

## 2016-09-05 MED ORDER — MIDAZOLAM HCL 2 MG/2ML IJ SOLN
1.0000 mg | INTRAMUSCULAR | Status: DC | PRN
  Administered 2016-09-05 (×2): 1 mg via INTRAVENOUS

## 2016-09-05 MED ORDER — HYDROMORPHONE HCL 1 MG/ML IJ SOLN: 0.2500 mg | INTRAMUSCULAR | Status: DC | PRN

## 2016-09-05 MED ORDER — CIPROFLOXACIN IN D5W 400 MG/200ML IV SOLN
400.0000 mg | INTRAVENOUS | Status: AC
  Administered 2016-09-05: 400 mg via INTRAVENOUS

## 2016-09-05 MED ORDER — ACETAMINOPHEN 500 MG PO TABS
1000.0000 mg | ORAL_TABLET | ORAL | Status: AC
  Administered 2016-09-05: 1000 mg via ORAL

## 2016-09-05 MED ORDER — PROPOFOL 10 MG/ML IV BOLUS
INTRAVENOUS | Status: DC | PRN
  Administered 2016-09-05: 120 mg via INTRAVENOUS

## 2016-09-05 MED ORDER — LACTATED RINGERS IV SOLN
INTRAVENOUS | Status: DC
  Administered 2016-09-05: 13:00:00 via INTRAVENOUS

## 2016-09-05 MED ORDER — TECHNETIUM TC 99M SULFUR COLLOID FILTERED
1.0000 | Freq: Once | INTRAVENOUS | Status: AC | PRN
  Administered 2016-09-05: 1 via INTRADERMAL

## 2016-09-05 MED ORDER — SERTRALINE HCL 100 MG PO TABS: 100.0000 mg | ORAL_TABLET | Freq: Every day | ORAL | Status: DC

## 2016-09-05 MED ORDER — LACTATED RINGERS IV SOLN
INTRAVENOUS | Status: DC
  Administered 2016-09-05 (×2): via INTRAVENOUS

## 2016-09-05 MED ORDER — PROPOFOL 500 MG/50ML IV EMUL
INTRAVENOUS | Status: AC
  Filled 2016-09-05: qty 50

## 2016-09-05 MED ORDER — BUPIVACAINE-EPINEPHRINE (PF) 0.25% -1:200000 IJ SOLN
INTRAMUSCULAR | Status: AC
  Filled 2016-09-05: qty 90

## 2016-09-05 MED ORDER — CIPROFLOXACIN IN D5W 400 MG/200ML IV SOLN
INTRAVENOUS | Status: AC
  Filled 2016-09-05: qty 200

## 2016-09-05 MED ORDER — BUPIVACAINE-EPINEPHRINE 0.25% -1:200000 IJ SOLN
INTRAMUSCULAR | Status: DC | PRN
  Administered 2016-09-05: 26 mL

## 2016-09-05 MED ORDER — INSULIN ASPART 100 UNIT/ML ~~LOC~~ SOLN
0.0000 [IU] | Freq: Three times a day (TID) | SUBCUTANEOUS | Status: DC
  Administered 2016-09-05: 5 [IU] via SUBCUTANEOUS
  Administered 2016-09-05: 8 [IU] via SUBCUTANEOUS
  Filled 2016-09-05 (×2): qty 1

## 2016-09-05 MED ORDER — DEXAMETHASONE SODIUM PHOSPHATE 10 MG/ML IJ SOLN
INTRAMUSCULAR | Status: AC
  Filled 2016-09-05: qty 1

## 2016-09-05 MED ORDER — ONDANSETRON HCL 4 MG/2ML IJ SOLN
INTRAMUSCULAR | Status: DC | PRN
  Administered 2016-09-05: 4 mg via INTRAVENOUS

## 2016-09-05 MED ORDER — ONDANSETRON 4 MG PO TBDP: 4.0000 mg | ORAL_TABLET | Freq: Four times a day (QID) | ORAL | Status: DC | PRN

## 2016-09-05 MED ORDER — LIDOCAINE 2% (20 MG/ML) 5 ML SYRINGE
INTRAMUSCULAR | Status: AC
  Filled 2016-09-05: qty 5

## 2016-09-05 MED ORDER — SODIUM CHLORIDE 0.9 % IJ SOLN
INTRAVENOUS | Status: DC | PRN
  Administered 2016-09-05: 5 mL via INTRAMUSCULAR

## 2016-09-05 MED ORDER — FENTANYL CITRATE (PF) 100 MCG/2ML IJ SOLN
INTRAMUSCULAR | Status: AC
  Filled 2016-09-05: qty 2

## 2016-09-05 MED ORDER — LIDOCAINE HCL (CARDIAC) 20 MG/ML IV SOLN
INTRAVENOUS | Status: DC | PRN
  Administered 2016-09-05: 30 mg via INTRAVENOUS

## 2016-09-05 MED ORDER — CARVEDILOL 3.125 MG PO TABS
3.1250 mg | ORAL_TABLET | Freq: Two times a day (BID) | ORAL | Status: DC
  Administered 2016-09-05: 3.125 mg via ORAL
  Filled 2016-09-05: qty 1

## 2016-09-05 MED ORDER — BUPIVACAINE-EPINEPHRINE (PF) 0.5% -1:200000 IJ SOLN
INTRAMUSCULAR | Status: DC | PRN
  Administered 2016-09-05: 30 mL via PERINEURAL

## 2016-09-05 MED ORDER — ACETAMINOPHEN 500 MG PO TABS
ORAL_TABLET | ORAL | Status: AC
  Filled 2016-09-05: qty 2

## 2016-09-05 MED ORDER — TRIAMTERENE-HCTZ 37.5-25 MG PO TABS: 1.0000 | ORAL_TABLET | Freq: Every day | ORAL | Status: DC

## 2016-09-05 MED ORDER — MIDAZOLAM HCL 2 MG/2ML IJ SOLN
INTRAMUSCULAR | Status: AC
  Filled 2016-09-05: qty 2

## 2016-09-05 MED ORDER — ONDANSETRON HCL 4 MG/2ML IJ SOLN
INTRAMUSCULAR | Status: AC
  Filled 2016-09-05: qty 2

## 2016-09-05 MED ORDER — ANASTROZOLE 1 MG PO TABS: 1.0000 mg | ORAL_TABLET | Freq: Every day | ORAL | Status: DC

## 2016-09-05 MED ORDER — METOCLOPRAMIDE HCL 5 MG/ML IJ SOLN
10.0000 mg | Freq: Once | INTRAMUSCULAR | Status: DC | PRN
Start: 2016-09-05 — End: 2016-09-06

## 2016-09-05 MED ORDER — EPHEDRINE SULFATE 50 MG/ML IJ SOLN
INTRAMUSCULAR | Status: DC | PRN
  Administered 2016-09-05: 10 mg via INTRAVENOUS

## 2016-09-05 MED ORDER — MORPHINE SULFATE (PF) 2 MG/ML IV SOLN: 2.0000 mg | INTRAVENOUS | Status: DC | PRN

## 2016-09-05 MED ORDER — GABAPENTIN 300 MG PO CAPS
ORAL_CAPSULE | ORAL | Status: AC
  Filled 2016-09-05: qty 1

## 2016-09-05 MED ORDER — GABAPENTIN 300 MG PO CAPS
300.0000 mg | ORAL_CAPSULE | ORAL | Status: AC
  Administered 2016-09-05: 300 mg via ORAL

## 2016-09-05 MED ORDER — SODIUM CHLORIDE 0.9 % IJ SOLN
INTRAMUSCULAR | Status: AC
  Filled 2016-09-05: qty 10

## 2016-09-05 MED ORDER — MEPERIDINE HCL 25 MG/ML IJ SOLN
6.2500 mg | INTRAMUSCULAR | Status: DC | PRN
Start: 2016-09-05 — End: 2016-09-06

## 2016-09-05 MED ORDER — FENTANYL CITRATE (PF) 100 MCG/2ML IJ SOLN
50.0000 ug | INTRAMUSCULAR | Status: AC | PRN
  Administered 2016-09-05 (×3): 25 ug via INTRAVENOUS
  Administered 2016-09-05: 50 ug via INTRAVENOUS
  Administered 2016-09-05: 25 ug via INTRAVENOUS
  Administered 2016-09-05: 50 ug via INTRAVENOUS

## 2016-09-05 MED ORDER — HYDROCODONE-ACETAMINOPHEN 7.5-325 MG PO TABS: 1.0000 | ORAL_TABLET | Freq: Once | ORAL | Status: DC | PRN

## 2016-09-05 MED ORDER — DEXAMETHASONE SODIUM PHOSPHATE 4 MG/ML IJ SOLN
INTRAMUSCULAR | Status: DC | PRN
  Administered 2016-09-05: 4 mg via INTRAVENOUS

## 2016-09-05 MED ORDER — OXYCODONE-ACETAMINOPHEN 5-325 MG PO TABS
1.0000 | ORAL_TABLET | ORAL | Status: DC | PRN
  Administered 2016-09-05: 1 via ORAL
  Administered 2016-09-05: 2 via ORAL
  Filled 2016-09-05: qty 1
  Filled 2016-09-05: qty 2

## 2016-09-05 MED ORDER — BUPIVACAINE HCL (PF) 0.25 % IJ SOLN
INTRAMUSCULAR | Status: AC
  Filled 2016-09-05: qty 30

## 2016-09-05 MED ORDER — ONDANSETRON HCL 4 MG/2ML IJ SOLN: 4.0000 mg | Freq: Four times a day (QID) | INTRAMUSCULAR | Status: DC | PRN

## 2016-09-05 MED ORDER — ENOXAPARIN SODIUM 40 MG/0.4ML ~~LOC~~ SOLN: 40.0000 mg | SUBCUTANEOUS | Status: DC

## 2016-09-05 MED ORDER — METHYLENE BLUE 0.5 % INJ SOLN
INTRAVENOUS | Status: AC
  Filled 2016-09-05: qty 10

## 2016-09-05 SURGICAL SUPPLY — 50 items
APPLIER CLIP 9.375 MED OPEN (MISCELLANEOUS) ×4
BINDER BREAST XLRG (GAUZE/BANDAGES/DRESSINGS) ×2 IMPLANT
BLADE SURG 15 STRL LF DISP TIS (BLADE) ×1 IMPLANT
BLADE SURG 15 STRL SS (BLADE) ×1
CANISTER SUCT 1200ML W/VALVE (MISCELLANEOUS) ×2 IMPLANT
CHLORAPREP W/TINT 26ML (MISCELLANEOUS) ×2 IMPLANT
CLIP APPLIE 9.375 MED OPEN (MISCELLANEOUS) ×2 IMPLANT
COVER BACK TABLE 60X90IN (DRAPES) ×2 IMPLANT
COVER MAYO STAND STRL (DRAPES) ×2 IMPLANT
DERMABOND ADVANCED (GAUZE/BANDAGES/DRESSINGS) ×1
DERMABOND ADVANCED .7 DNX12 (GAUZE/BANDAGES/DRESSINGS) ×1 IMPLANT
DEVICE DUBIN W/COMP PLATE 8390 (MISCELLANEOUS) ×2 IMPLANT
DRAIN CHANNEL 19F RND (DRAIN) ×2 IMPLANT
DRAPE LAPAROSCOPIC ABDOMINAL (DRAPES) ×2 IMPLANT
DRAPE UTILITY XL STRL (DRAPES) ×2 IMPLANT
DRSG PAD ABDOMINAL 8X10 ST (GAUZE/BANDAGES/DRESSINGS) ×2 IMPLANT
ELECT COATED BLADE 2.86 ST (ELECTRODE) ×2 IMPLANT
ELECT REM PT RETURN 9FT ADLT (ELECTROSURGICAL) ×2
ELECTRODE REM PT RTRN 9FT ADLT (ELECTROSURGICAL) ×1 IMPLANT
EVACUATOR SILICONE 100CC (DRAIN) ×2 IMPLANT
GAUZE SPONGE 4X4 12PLY STRL (GAUZE/BANDAGES/DRESSINGS) ×2 IMPLANT
GLOVE BIOGEL PI IND STRL 7.5 (GLOVE) ×3 IMPLANT
GLOVE BIOGEL PI IND STRL 8 (GLOVE) ×1 IMPLANT
GLOVE BIOGEL PI INDICATOR 7.5 (GLOVE) ×3
GLOVE BIOGEL PI INDICATOR 8 (GLOVE) ×1
GLOVE ECLIPSE 7.5 STRL STRAW (GLOVE) ×2 IMPLANT
GLOVE SURG SYN 7.5  E (GLOVE) ×2
GLOVE SURG SYN 7.5 E (GLOVE) ×2 IMPLANT
GOWN STRL REUS W/ TWL LRG LVL3 (GOWN DISPOSABLE) ×4 IMPLANT
GOWN STRL REUS W/ TWL XL LVL3 (GOWN DISPOSABLE) ×1 IMPLANT
GOWN STRL REUS W/TWL LRG LVL3 (GOWN DISPOSABLE) ×4
GOWN STRL REUS W/TWL XL LVL3 (GOWN DISPOSABLE) ×1
ILLUMINATOR WAVEGUIDE N/F (MISCELLANEOUS) ×2 IMPLANT
KIT MARKER MARGIN INK (KITS) ×2 IMPLANT
NEEDLE HYPO 25X1 1.5 SAFETY (NEEDLE) ×2 IMPLANT
PACK BASIN DAY SURGERY FS (CUSTOM PROCEDURE TRAY) ×2 IMPLANT
PENCIL BUTTON HOLSTER BLD 10FT (ELECTRODE) ×2 IMPLANT
SLEEVE SCD COMPRESS KNEE MED (MISCELLANEOUS) ×2 IMPLANT
SPONGE LAP 4X18 X RAY DECT (DISPOSABLE) ×4 IMPLANT
SUT ETHILON 3 0 PS 1 (SUTURE) ×2 IMPLANT
SUT MNCRL AB 4-0 PS2 18 (SUTURE) ×4 IMPLANT
SUT VIC AB 3-0 54X BRD REEL (SUTURE) ×1 IMPLANT
SUT VIC AB 3-0 BRD 54 (SUTURE) ×1
SUT VICRYL 3-0 CR8 SH (SUTURE) ×4 IMPLANT
SYR BULB 3OZ (MISCELLANEOUS) ×2 IMPLANT
SYR CONTROL 10ML LL (SYRINGE) ×2 IMPLANT
TOWEL OR 17X24 6PK STRL BLUE (TOWEL DISPOSABLE) ×2 IMPLANT
TOWEL OR NON WOVEN STRL DISP B (DISPOSABLE) ×2 IMPLANT
TUBE CONNECTING 20X1/4 (TUBING) ×2 IMPLANT
YANKAUER SUCT BULB TIP NO VENT (SUCTIONS) ×2 IMPLANT

## 2016-09-05 NOTE — Anesthesia Postprocedure Evaluation (Signed)
Anesthesia Post Note  Patient: Sandra Compton  Procedure(s) Performed: Procedure(s) (LRB): RIGHT BREAST SEED GUIDED LUMPECTOMY, RIGHT AXILLARY SEED TARGETED DISSECTION AND RIGHT AXILLARY DISSECTION (Right)  Patient location during evaluation: PACU Anesthesia Type: General and Regional Level of consciousness: awake and alert and oriented Pain management: pain level controlled Vital Signs Assessment: post-procedure vital signs reviewed and stable Respiratory status: spontaneous breathing, nonlabored ventilation and respiratory function stable Cardiovascular status: blood pressure returned to baseline and stable Postop Assessment: no signs of nausea or vomiting Anesthetic complications: no    Last Vitals:  Vitals:   09/05/16 1130 09/05/16 1145  BP: (!) 165/82 (!) 173/84  Pulse: 93 (!) 102  Resp: 14 16  Temp:      Last Pain:  Vitals:   09/05/16 1145  TempSrc:   PainSc: 0-No pain                 Jayleah Garbers A.

## 2016-09-05 NOTE — Anesthesia Procedure Notes (Addendum)
Anesthesia Regional Block:  Pectoralis block  Pre-Anesthetic Checklist: ,, timeout performed, Correct Patient, Correct Site, Correct Laterality, Correct Procedure, Correct Position, site marked, Risks and benefits discussed,  Surgical consent,  Pre-op evaluation,  At surgeon's request and post-op pain management  Laterality: Right  Prep: chloraprep       Needles:  Injection technique: Single-shot  Needle Type: Echogenic Stimulator Needle     Needle Length: 9cm 9 cm Needle Gauge: 21 and 21 G  Needle insertion depth: 6 cm   Additional Needles:  Procedures: ultrasound guided (picture in chart) Pectoralis block Narrative:  Start time: 09/05/2016 8:05 AM End time: 09/05/2016 8:15 AM Injection made incrementally with aspirations every 5 mL.  Performed by: Personally  Anesthesiologist: Josephine Igo  Additional Notes: Relevant anatomy ID'd using [place]. Incremental 61ml injection with frequent aspiration. Patient tolerated procedure well.

## 2016-09-05 NOTE — Progress Notes (Signed)
Assisted Dr. Foster with right, ultrasound guided, pectoralis block. Side rails up, monitors on throughout procedure. See vital signs in flow sheet. Tolerated Procedure well. 

## 2016-09-05 NOTE — Interval H&P Note (Signed)
History and Physical Interval Note:  09/05/2016 8:29 AM  Sandra Compton  has presented today for surgery, with the diagnosis of RIGHT BREAST CANCER  The various methods of treatment have been discussed with the patient and family. After consideration of risks, benefits and other options for treatment, the patient has consented to  Procedure(s): RIGHT BREAST SEED GUIDED LUMPECTOMY , RIGHT AXILLARY SEED TARGETED DISSECTION AND RIGHT SENTINEL LYMPH NODE BIOSY (Right) as a surgical intervention .  The patient's history has been reviewed, patient examined, no change in status, stable for surgery.  I have reviewed the patient's chart and labs.  Questions were answered to the patient's satisfaction.     Jerra Huckeby T

## 2016-09-05 NOTE — Anesthesia Procedure Notes (Signed)
Procedure Name: LMA Insertion Date/Time: 09/05/2016 8:45 AM Performed by: Venus Gilles D Pre-anesthesia Checklist: Patient identified, Emergency Drugs available, Suction available and Patient being monitored Patient Re-evaluated:Patient Re-evaluated prior to inductionOxygen Delivery Method: Circle system utilized Preoxygenation: Pre-oxygenation with 100% oxygen Intubation Type: IV induction Ventilation: Mask ventilation without difficulty LMA: LMA inserted LMA Size: 4.0 Number of attempts: 1 Airway Equipment and Method: Bite block Placement Confirmation: positive ETCO2 Tube secured with: Tape Dental Injury: Teeth and Oropharynx as per pre-operative assessment

## 2016-09-05 NOTE — Transfer of Care (Signed)
Immediate Anesthesia Transfer of Care Note  Patient: Sandra Compton  Procedure(s) Performed: Procedure(s): RIGHT BREAST SEED GUIDED LUMPECTOMY, RIGHT AXILLARY SEED TARGETED DISSECTION AND RIGHT AXILLARY DISSECTION (Right)  Patient Location: PACU  Anesthesia Type:GA combined with regional for post-op pain  Level of Consciousness: awake and patient cooperative  Airway & Oxygen Therapy: Patient Spontanous Breathing and Patient connected to face mask oxygen  Post-op Assessment: Report given to RN and Post -op Vital signs reviewed and stable  Post vital signs: Reviewed and stable  Last Vitals:  Vitals:   09/05/16 0835 09/05/16 0840  BP:    Pulse: 78 94  Resp: 13 16  Temp:      Last Pain:  Vitals:   09/05/16 0712  TempSrc: Oral         Complications: No apparent anesthesia complications

## 2016-09-05 NOTE — Anesthesia Preprocedure Evaluation (Addendum)
Anesthesia Evaluation  Patient identified by MRN, date of birth, ID band Patient awake    Reviewed: Allergy & Precautions, NPO status , Patient's Chart, lab work & pertinent test results  Airway Mallampati: III       Dental no notable dental hx. (+) Teeth Intact, Caps   Pulmonary neg pulmonary ROS,  Snores-? Undiagnosed OSA   Pulmonary exam normal breath sounds clear to auscultation       Cardiovascular hypertension, Pt. on home beta blockers Normal cardiovascular exam+ dysrhythmias + Valvular Problems/Murmurs  Rhythm:Regular Rate:Normal  LBBB- recently diagnosed Cardiac w/u negative   Neuro/Psych PSYCHIATRIC DISORDERS Depression negative neurological ROS     GI/Hepatic   Endo/Other  diabetes, Well Controlled, Type 2, Oral Hypoglycemic AgentsMorbid obesityRight breast Ca  Renal/GU   negative genitourinary   Musculoskeletal  (+) Arthritis , Fibromyalgia -  Abdominal (+) + obese,   Peds  Hematology   Anesthesia Other Findings   Reproductive/Obstetrics                          EKG: normal sinus rhythm, LBBB, left axis deviation.  Anesthesia Physical Anesthesia Plan  ASA: III  Anesthesia Plan: General and Regional   Post-op Pain Management:  Regional for Post-op pain   Induction: Intravenous  Airway Management Planned: LMA  Additional Equipment:   Intra-op Plan:   Post-operative Plan: Extubation in OR  Informed Consent: I have reviewed the patients History and Physical, chart, labs and discussed the procedure including the risks, benefits and alternatives for the proposed anesthesia with the patient or authorized representative who has indicated his/her understanding and acceptance.   Dental advisory given  Plan Discussed with: CRNA, Anesthesiologist and Surgeon  Anesthesia Plan Comments:         Anesthesia Quick Evaluation

## 2016-09-05 NOTE — Op Note (Signed)
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 Local stage IIa. She had a low mammogram score and neoadjuvant chemotherapy was not recommended. After extensive preoperative discussion and workup detailed elsewhere we have elected to proceed with radioactive seed localized right breast lumpectomy and targeted radioactive seed localized right axillary dissection and sentinel lymph node biopsy is initial surgical treatment. I discussed the procedure with the patient and her husband including indications and nature of the procedure and possible need for further surgery including reexcision of the lumpectomy site and possible need for axillary dissection. They understand and agree to proceed.    Procedure Detail:  Patient had preoperatively undergone placement of radioactive seed in the right breast at the tumor site and in the right axillary lymph node which had previously been marked. She underwent injection of 1 mCi of technetium sulfur colloid intradermally around the right nipple in the holding area. She was taken to the operating room, placed in the supine position on the operating table, and laryngeal mask general anesthesia induced. Under sterile technique after patient timeout checked in 5 mL of dilute methylene blue subcutaneous trace edema in the right nipple and massaged this for several minutes. The entire right breast and axilla and upper arm were widely sterilely prepped and  draped. She received preoperative IV antibiotics. PAS were in place. Patient timeout was performed and procedure verified. The lumpectomy was approached initially. The area of high counts was localized in the upper right breast. A circumareolar incision was made and the skin and subcutaneous flap raised superiorly and of the breast overlying the area of high counts. There was some slight palpable thickening. Using the neoprobe for guidance I excised initially a generous specimen of breast tissue down toward the chest wall over the area of high counts. As the specimen was being removed the seed was seen in the lumpectomy cavity displaced and was removed for x-ray. At this point the tumor was easily palpable at the deep and superior aspect of the lumpectomy specimen. The specimen was removed and inked for margins. The seed was confirmed. The marking clip was not seen within the specimen. I then reexcised generously the deep margin down to the pectoralis muscle, the superior margin and the deep aspect of the inferior margin each of which was inked and sent separately and also x-rayed with the marking clip not seen. At this point I had done a quite wide lumpectomy around the palpable tumor and I suspect that the marking clip was displaced during the dissection. Complete hemostasis was obtained of the lumpectomy cavity. The cavity was marked with clips. The deep breast esophagitis tissue was closed with interrupted Vicryls.  Attention was turned to the axilla. A small transverse incision was made over the area of high counts and dissection was carried down through simultaneous tissue and into the axilla proper. There was immediately apparent and large lymph node about 2-2-1/2 cm with very high counts and this was excised with cautery. During the dissection the seed was displaced and was removed and confirmed separately. This node  was completely excised. The marking clip was not seen on x-ray. The neoprobe was switched  to technetium and a hot node with some slight blue dye was seen just deep to the one I had removed and this node was also enlarged to about 1-1/2-2 cm. It was excised and had very high counts ex vivo. Palpating within the axilla however I was able to feel several definitely enlarged lymph nodes throughout the axillary contents. At this point I felt the best course as discussed previously was to proceed with complete axillary dissection. The incision was extended somewhat. The dissection was deepened down through the talus spent separating the axillary contents from the tail of Spence. Posteriorly dissection was carried down to the anterior border of the latissimus. The lateral border of the pectoralis was dissected and retracted medially. Clavipectoral fascia was incised in working underneath the pectoralis minor the axillary vein was identified and protected. All fibrofatty tissue inferior to the axillary vein was then swept laterally dividing perforating vessels between clips. The intercostal nerve was divided. As the dissection progressed laterally the specimen was dissected inferiorly off the axillary vein and the thoracodorsal nerve and vessels were identified laterally in the long thoracic nerve identified medially against the chest wall. All fibrofatty tissue between these structures was then swept inferiorly and final attachments along the serratus and latissimus were divided and the specimen was removed. The nerves were intact and functioning. Wound was irrigated there was no evidence of bleeding. A 19 Blake closed suction drain was left in the wound. The subcutaneous tissue was closed with interrupted 3-0 Vicryl. Both skin incisions were closed with subcuticular 5-0 Monocryl and Dermabond. Sponge needle and instrument counts were correct.    Findings: As above  Estimated Blood Loss:  less than 50 mL         Drains: 18 Blake drain in axilla  Blood Given: none          Specimens: #1 right breast  lumpectomy  #2 further lumpectomy margins-deep, superior, and deep inferior all oriented  #3 seed targeted axillary lymph node    #4 right axillary sentinel lymph node   #5 axillary contents (dissection)        Complications:  * No complications entered in OR log *         Disposition: PACU - hemodynamically stable.         Condition: stable

## 2016-09-06 ENCOUNTER — Encounter (HOSPITAL_BASED_OUTPATIENT_CLINIC_OR_DEPARTMENT_OTHER): Payer: Self-pay | Admitting: General Surgery

## 2016-09-06 DIAGNOSIS — C50211 Malignant neoplasm of upper-inner quadrant of right female breast: Secondary | ICD-10-CM | POA: Diagnosis not present

## 2016-09-06 LAB — POCT HEMOGLOBIN-HEMACUE: HEMOGLOBIN: 13.9 g/dL (ref 12.0–15.0)

## 2016-09-06 LAB — GLUCOSE, CAPILLARY: GLUCOSE-CAPILLARY: 159 mg/dL — AB (ref 65–99)

## 2016-09-06 MED ORDER — HYDROCODONE-ACETAMINOPHEN 7.5-325 MG PO TABS: 1.0000 | ORAL_TABLET | Freq: Once | ORAL | 0 refills | Status: AC | PRN

## 2016-09-06 NOTE — Progress Notes (Signed)
1 Day Post-Op  Subjective: No complaints this morning. Chest "sore" under her arm. Ready to go home.  Objective: Vital signs in last 24 hours: Temp:  [97 F (36.1 C)-99.1 F (37.3 C)] 97.4 F (36.3 C) (11/29 2330) Pulse Rate:  [67-106] 88 (11/29 2330) Resp:  [12-20] 18 (11/29 2330) BP: (139-190)/(72-95) 152/72 (11/29 2330) SpO2:  [90 %-100 %] 95 % (11/29 2330) Weight:  [85.7 kg (189 lb)] 85.7 kg (189 lb) (11/29 0712)    Intake/Output from previous day: 11/29 0701 - 11/30 0700 In: 2936 [P.O.:1336; I.V.:1600] Out: [id number] [Urine:3300; Drains:70] Intake/Output this shift: Total I/O In: 462 [P.O.:462] Out: 1350 [Urine:1300; Drains:50]  General appearance: alert, cooperative and no distress Incision/Wound: Clean and dry. No swelling. JP drainage is mostly serous  Lab Results:  No results for input(s): WBC, HGB, HCT, PLT in the last 72 hours. BMET No results for input(s): NA, K, CL, CO2, GLUCOSE, BUN, CREATININE, CALCIUM in the last 72 hours.   Studies/Results: Mm Breast Surgical Specimen  Result Date: 09/05/2016 CLINICAL DATA:  Biopsy proven metastatic carcinoma in a right axillary lymph node. EXAM: SPECIMEN RADIOGRAPH OF THE RIGHT BREAST COMPARISON:  Previous exam(s). FINDINGS: Status post excision of the right axilla. Specimen radiograph was obtained. No radioactive seed or surgical clip is seen in the surgical specimen. Radiograph of a second container has the radioactive seed. Dr. Donne Hazel is aware the specimen did not contain the surgical clip but states that an axillary dissection is being performed. IMPRESSION: Specimen radiograph of the right axilla. Electronically Signed   By: Lillia Mountain M.D.   On: 09/05/2016 10:16   Mm Breast Surgical Specimen  Result Date: 09/05/2016 CLINICAL DATA:  Biopsy proven invasive mammary carcinoma and carcinoma in-situ in the right breast. EXAM: SPECIMEN RADIOGRAPH OF THE RIGHT BREAST COMPARISON:  Previous exam(s). FINDINGS: Status post  excision of the right breast. Specimen radiograph was obtained. No marker clip is seen in the surgical specimen. Separate container shows presence of the radioactive seed. Dr. Donne Hazel is aware the biopsy clip is not in the surgical specimen. IMPRESSION: Specimen radiograph of the right breast. Biopsy clip is not present in the surgical specimen. Electronically Signed   By: Lillia Mountain M.D.   On: 09/05/2016 09:42    Anti-infectives: Anti-infectives    Start     Dose/Rate Route Frequency Ordered Stop   09/05/16 0731  ciprofloxacin (CIPRO) IVPB 400 mg     400 mg 200 mL/hr over 60 Minutes Intravenous On call to O.R. 09/05/16 0731 09/05/16 0905      Assessment/Plan: s/p Procedure(s): RIGHT BREAST SEED GUIDED LUMPECTOMY, RIGHT AXILLARY SEED TARGETED DISSECTION AND RIGHT AXILLARY DISSECTION Doing well postoperatively and ready for discharge this morning.   LOS: 0 days    Murle Otting T 11/30/2017Patient ID: Sandra Compton, female   DOB: [birth year], 60 y.o.   MRN: [id number]

## 2016-09-06 NOTE — Discharge Summary (Signed)
  Patient ID: Sandra Compton [id number] 60 y.o. 12/17/1955  09/05/2016  Discharge date and time: 09/06/2016   Admitting Physician: Excell Seltzer T  Discharge Physician: Excell Seltzer T  Admission Diagnoses: RIGHT BREAST CANCER  Discharge Diagnoses: Same  Operations: Procedure(s): RIGHT BREAST SEED GUIDED LUMPECTOMY, RIGHT AXILLARY SEED TARGETED DISSECTION AND RIGHT AXILLARY DISSECTION  Admission Condition: good  Discharged Condition: good  Indication for Admission: Patient with recent diagnosis of stage II cancer of the breast with positive axillary lymph node. After extensive preoperative workup and discussion she presents for elective radioactive seed localized right breast lumpectomy and radioactive seed targeted right axillary dissection.  Hospital Course: Patient underwent a radioactive seed targeted right breast lumpectomy. She underwent targeted right axillary lymph node removal and sentinel lymph node biopsy but several enlarged lymph nodes in the axilla were noted at the time of surgery and I elected to proceed with axillary dissection. She tolerated this well. On the following morning she has just soreness and denies pain. Incisions are clean without swelling or unusual drainage and she is felt ready for discharge.    Disposition: Home  Patient Instructions:    Medication List    TAKE these medications   anastrozole 1 MG tablet Commonly known as:  ARIMIDEX Take 1 tablet (1 mg total) by mouth daily.   BIOGAIA PROBIOTIC Liqd Take by mouth daily at 8 pm.   carvedilol 3.125 MG tablet Commonly known as:  COREG Take 1 tablet (3.125 mg total) by mouth 2 (two) times daily.   FARXIGA 5 MG Tabs tablet Generic drug:  dapagliflozin propanediol Take 5 mg by mouth daily.   glyBURIDE-metformin 5-500 MG tablet Commonly known as:  GLUCOVANCE   HYDROcodone-acetaminophen 7.5-325 MG tablet Commonly known as:  NORCO Take 1-2 tablets by mouth once as needed (for  pain score 1-4).   sertraline 100 MG tablet Commonly known as:  ZOLOFT   triamterene-hydrochlorothiazide 37.5-25 MG tablet Commonly known as:  MAXZIDE-25       Activity: activity as tolerated Diet: regular diet Wound Care: Empty JP drain twice daily  Follow-up:  With Dr. Excell Seltzer in 1 week.  Signed: Edward Jolly MD, FACS  09/06/2016, 6:52 AM

## 2016-09-06 NOTE — Discharge Instructions (Signed)
[facility] °[phone] ° °MASTECTOMY: POST OP INSTRUCTIONS ° °Always review your discharge instruction sheet given to you by the facility where your surgery was performed. °IF YOU HAVE DISABILITY OR FAMILY LEAVE FORMS, YOU MUST BRING THEM TO THE OFFICE FOR PROCESSING.   °DO NOT GIVE THEM TO YOUR DOCTOR. °A prescription for pain medication may be given to you upon discharge.  Take your pain medication as prescribed, if needed.  If narcotic pain medicine is not needed, then you may take acetaminophen (Tylenol) or ibuprofen (Advil) as needed. °1. Take your usually prescribed medications unless otherwise directed. °2. If you need a refill on your pain medication, please contact your pharmacy.  They will contact our office to request authorization.  Prescriptions will not be filled after 5pm or on week-ends. °3. You should follow a light diet the first few days after arrival home, such as soup and crackers, etc.  Resume your normal diet the day after surgery. °4. Most patients will experience some swelling and bruising on the chest and underarm.  Ice packs will help.  Swelling and bruising can take several days to resolve.  °5. It is common to experience some constipation if taking pain medication after surgery.  Increasing fluid intake and taking a stool softener (such as Colace) will usually help or prevent this problem from occurring.  A mild laxative (Milk of Magnesia or Miralax) should be taken according to package instructions if there are no bowel movements after 48 hours. °6. Unless discharge instructions indicate otherwise, leave your bandage dry and in place until your next appointment in 3-5 days.  You may take a limited sponge bath.  No tube baths or showers until the drains are removed.  You may have steri-strips (small skin tapes) in place directly over the incision.  These strips should be left on the skin for 7-10 days.  If your surgeon used skin glue on the incision, you may  shower in 24 hours.  The glue will flake off over the next 2-3 weeks.  Any sutures or staples will be removed at the office during your follow-up visit. °7. DRAINS:  If you have drains in place, it is important to keep a list of the amount of drainage produced each day in your drains.  Before leaving the hospital, you should be instructed on drain care.  Call our office if you have any questions about your drains. °8. ACTIVITIES:  You may resume regular (light) daily activities beginning the next day--such as daily self-care, walking, climbing stairs--gradually increasing activities as tolerated.  You may have sexual intercourse when it is comfortable.  Refrain from any heavy lifting or straining until approved by your doctor. °a. You may drive when you are no longer taking prescription pain medication, you can comfortably wear a seatbelt, and you can safely maneuver your car and apply brakes. °b. RETURN TO WORK:  __________________________________________________________ °9. You should see your doctor in the office for a follow-up appointment approximately 3-5 days after your surgery.  Your doctor’s nurse will typically make your follow-up appointment when she calls you with your pathology report.  Expect your pathology report 2-3 business days after your surgery.  You may call to check if you do not hear from us after three days.   °10. OTHER INSTRUCTIONS: ______________________________________________________________________________________________ ____________________________________________________________________________________________ °WHEN TO CALL YOUR DOCTOR: °1. Fever over 101.0 °2. Nausea and/or vomiting °3. Extreme swelling or bruising °4. Continued bleeding from incision. °5. Increased pain, redness, or drainage from the incision. °  The clinic staff is available to answer your questions during regular business hours.  Please don’t hesitate to call and ask to speak to one of the nurses for clinical  concerns.  If you have a medical emergency, go to the nearest emergency room or call 911.  A surgeon from [facility] is always on call at the hospital. °[address], [address], [place], [place]  [address] ? [address] [address], [place], [place]   [address] °[phone] ? [phone] ? FAX [phone] °Web site: [email or web address] °

## 2016-09-12 NOTE — Progress Notes (Signed)
Mrs. [name] is here for a follow up new for positive ductal carcinoma of  the right upper inner breast.     Diagnosis 09-05-16 1. Breast, lumpectomy, Right - INVASIVE GRADE 2 DUCTAL CARCINOMA, SPANNING 1.1 CM IN GREATEST DIMENSION. - ASSOCIATED INTERMEDIATE GRADE DUCTAL CARCINOMA IN SITU. - INVASIVE DUCTAL CARCINOMA BROADLY INVOLVES SUPERIOR MARGIN. - DUCTAL CARCINOMA IN SITU FOCALLY IS LESS THAN 0.1 CM TO SUPERIOR MARGIN. - INVASIVE DUCTAL CARCINOMA BROADLY INVOLVES POSTERIOR MARGIN. - DUCTAL CARCINOMA IN SITU IS BROADLY LESS THAN 0.1 CM FROM POSTERIOR MARGIN AND FOCALLY INVOLVES POSTERIOR MARGIN. - INVASIVE DUCTAL CARCINOMA IS FOCALLY LESS THAN 0.1 CM TO MEDIAL MARGIN AND DUCTAL CARCINOMA IN SITU IS FOCALLY LESS THAN 0.01 CM TO MEDIAL MARGIN. - OTHER MARGINS ARE NEGATIVE. - LYMPH/VASCULAR INVASION IS IDENTIFIED. - SEE ONCOLOGY TEMPLATE. 2. Breast, excision, Right additional Deep Margin - BENIGN BREAST PARENCHYMA SHOWING FIBROCYSTIC CHANGES. - BENIGN FIBROFATTY SOFT TISSUE. - NO ATYPIA OR TUMOR SEEN. 3. Breast, excision, Right additional Superior Margin - BENIGN FIBROFATTY SOFT TISSUE. - NO ATYPIA OR TUMOR SEEN. 4. Breast, excision, Right additional Deep/Inferior Margin - BENIGN FIBROFATTY SOFT TISSUE. - NO ATYPIA OR TUMOR SEEN. 5. Lymph node, biopsy, Right Axillary - ONE FATTY LYMPH NODE POSITIVE FOR METASTATIC DUCTAL CARCINOMA (1/1). 6. Lymph node, sentinel, biopsy, Right Axillary - ONE FATTY LYMPH NODE POSITIVE FOR METASTATIC DUCTAL CARCINOMA (1/1). 7. Lymph node, sentinel, biopsy, Right - ONE FATTY LYMPH NODE WITH NO TUMOR SEEN (0/1). 8. Lymph nodes, regional resection, Right Axillary Contents - TWO OF TWELVE FATTY LYMPH NODES POSITIVE FOR METASTATIC DUCTAL CARCINOMA (2/12).  Pain: 3/10 right arm Skin changes:Has a jackson pratt to upper outer breast 35 cc daily light yellow to light brown in color, no odor when she empties the [employer].  Instructed to put  neosporin around the Hines prat site last week by Dr. Excell Seltzer was having a small amount of pinkish drainage. Afebrile today at 97.8  Will see Dr. Excell Seltzer Wednesday 09-19-16 for a follow up appointment if the jackson pratt is down to 15 cc she can do her appointment next week. Lymphedema:None Wt Readings from Last 3 Encounters:  09/17/16 190 lb 9.6 oz (86.5 kg)  09/05/16 189 lb (85.7 kg)  08/29/16 189 lb 3.2 oz (85.8 kg)  BP (!) 161/72   Pulse 93   Temp 97.8 F (36.6 C) (Oral)   Resp 18   Ht 5\' 2"  (1.575 m)   Wt 190 lb 9.6 oz (86.5 kg)   LMP  (LMP Unknown)   SpO2 99%   BMI 34.86 kg/m

## 2016-09-13 ENCOUNTER — Ambulatory Visit: Payer: BLUE CROSS/BLUE SHIELD | Admitting: Hematology and Oncology

## 2016-09-14 ENCOUNTER — Encounter: Payer: Self-pay | Admitting: *Deleted

## 2016-09-17 ENCOUNTER — Ambulatory Visit
Admission: RE | Admit: 2016-09-17 | Discharge: 2016-09-17 | Disposition: A | Discharge status: Home / Self Care | Payer: BLUE CROSS/BLUE SHIELD | Source: Ambulatory Visit | Attending: Radiation Oncology | Admitting: Radiation Oncology

## 2016-09-17 ENCOUNTER — Encounter: Payer: Self-pay | Admitting: Radiation Oncology

## 2016-09-17 VITALS — BP 161/72 | HR 93 | Temp 97.8°F | Resp 18 | Ht 62.0 in | Wt 190.6 lb

## 2016-09-17 DIAGNOSIS — C50211 Malignant neoplasm of upper-inner quadrant of right female breast: Secondary | ICD-10-CM

## 2016-09-17 DIAGNOSIS — Z170 Estrogen receptor positive status [ER+]: Principal | ICD-10-CM

## 2016-09-18 NOTE — Progress Notes (Signed)
Radiation Oncology         [phone] ________________________________  Initial Outpatient Consultation  Name: Sandra Compton MRN: [id number]  Date: 09/17/2016  DOB: 1956-09-24  QM:VHQIONGE,XBMWU, MD  Nicholas Lose, MD   REFERRING PHYSICIAN: Nicholas Lose, MD  DIAGNOSIS: 60 y.o. 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 - Clinical stage IIA  No diagnosis found.  HISTORY OF PRESENT ILLNESS: Sandra Compton is a 60 y.o. female with a recent diagnosis of right breast cancer. The patient had a screening mammogram at [facility] on 06/04/2016 that revealed an abnormality in the right breast. She returned for further imaging at [facility] on 06/07/2016 where she was found to have a mass measuring 1.5 cm in diameter at 1:30 position. There was also a 3 cm right axillary lymph node. She subsequently underwent diagnostic imaging and a biopsy of both the breast mass in the lymph node. 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 The lymph node biopsy was also positive for her cancer. Her biopsy has been submitted for mammoprint, and she was started on anastrazole, but will not need additional systemic therapy. She underwent lumpectomy with see d targeted axillary dissection on 09/05/16. Final pathology confirmed a grade 2, invasive ductal carcinoma measuring 1.1 cm, with involvement of the posterior margin, superior margin, and DCIS less than 1 mm to superior margin, and the same distance from the margin, focally involving the posterior margin. Also along the medial margin, IDC and DCIS was noted less than 48m from the medial margin. Additional excision of the deep margin, superior margin, deep inferior margins were also performed and no atypia or tumor was seen in these specimens of the axillary nodes on the right, 4/15 nodes were positive for disease. She comes today to discuss plans  for radiotherapy.  PREVIOUS RADIATION THERAPY: No  PAST MEDICAL HISTORY:  Past Medical History:  Diagnosis Date  . Arthritis   . Breast cancer ([facility]    a. 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  . Depression   . Diabetes mellitus without complication ([facility]    a. Dx ~ 2004.  . Fibromyalgia   . Hypertension    a. > 20 yr hx.  .Marland KitchenLBBB (left bundle branch block)    a. Dx 08/2016.  . Seasonal allergies   . Snores   . Systolic murmur       PAST SURGICAL HISTORY: Past Surgical History:  Procedure Laterality Date  . ABDOMINAL HYSTERECTOMY    . DILATION AND CURETTAGE OF UTERUS    . MOUTH SURGERY    . RADIOACTIVE SEED GUIDED PARTIAL MASTECTOMY/AXILLARY SENTINEL NODE BIOPSY/AXILLARY NODE DISSECTION Right 09/05/2016   Procedure: RIGHT BREAST SEED GUIDED LUMPECTOMY, RIGHT AXILLARY SEED TARGETED DISSECTION AND RIGHT AXILLARY DISSECTION;  Surgeon: BExcell Seltzer MD;  [facility] [facility]  Service: General;  Laterality: Right;  . TONSILLECTOMY      FAMILY HISTORY:  Family History  Problem Relation Age of Onset  . Cancer Paternal Aunt     breast  . Cancer Other     breast  . Heart failure Mother     died suddenly @ 665  . Stroke Father     died @ 850 ? h/o silent MI.  .Marland KitchenHypertension Father   . Diabetes Brother   . Hypertension Brother   .  Brain cancer Brother   . Hypertension Brother     SOCIAL HISTORY:  Social History   Social History  . Marital status: Married    Spouse name: N/A  . Number of children: N/A  . Years of education: N/A   Occupational History  . 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 Smoking status: Never Smoker  . Smokeless tobacco: Never Used  . Alcohol use No  . Drug use: No  . Sexual activity: Yes   Other Topics Concern  . Not on file   Social History  Narrative   Lives in [place] with her husband. She does not routinely exercise.    ALLERGIES: Codeine and Penicillins  MEDICATIONS:  Current Outpatient Prescriptions  Medication Sig Dispense Refill  . anastrozole (ARIMIDEX) 1 MG tablet Take 1 tablet (1 mg total) by mouth daily. 90 tablet 3  . BIOGAIA PROBIOTIC (BIOGAIA PROBIOTIC) LIQD Take by mouth daily at 8 pm.    . carvedilol (COREG) 3.125 MG tablet Take 1 tablet (3.125 mg total) by mouth 2 (two) times daily. 60 tablet 6  . dapagliflozin propanediol (FARXIGA) 5 MG TABS tablet Take 5 mg by mouth daily.    . glyBURIDE-metformin (GLUCOVANCE) 5-500 MG tablet   0  . HYDROcodone-acetaminophen (NORCO) 7.5-325 MG tablet Take 1-2 tablets by mouth once as needed (for pain score 1-4). 20 tablet 0  . sertraline (ZOLOFT) 100 MG tablet   0  . triamterene-hydrochlorothiazide (MAXZIDE-25) 37.5-25 MG tablet   0   No current facility-administered medications for this encounter.     REVIEW OF SYSTEMS:  On review of systems, the patient reports that she is doing well overall. She is ready to have her JP pulled, and is more so aggravated by having to still need the drain, but she is faithfully recording its output and is averaging 35 mL/day. She denies pain associated with her incisions. She denies any chest pain, shortness of breath, cough, fevers, chills, night sweats, unintended weight changes. She denies any bowel or bladder disturbances, and denies abdominal pain, nausea or vomiting. She denies any new musculoskeletal or joint aches or pains. A complete review of systems is obtained and is otherwise negative.    PHYSICAL EXAM:  height is 5' 2" (1.575 m) and weight is 190 lb 9.6 oz (86.5 kg). Her oral temperature is 97.8 F (36.6 C). Her blood pressure is 161/72 (abnormal) and her pulse is 93. Her respiration is 18 and oxygen saturation is 99%.   Pain Scale 0/10 In general this is a well appearing caucasian female in no acute distress. She's alert and  oriented x4 and appropriate throughout the examination. Cardiopulmonary assessment is negative for acute distress and she exhibits normal effort. The right breast and axillary region were assessed. She has an indwelling JP drain beneath the axillary incision as well as an incision from 11-2 o'clock of the breast just superior the nipple. Both incisions and drain site are free of rash or cellulitic change. No fluid accumulation is noted deep to the incisions.  KPS = 90  100 - Normal; no complaints; no evidence of disease. 90   - Able to carry on normal activity; minor signs or symptoms of disease. 80   - Normal activity with effort; some signs or symptoms of disease. 70   - Cares for self; unable to carry on normal activity or to do active work. 60   -   Requires occasional assistance, but is able to care for most of his personal needs. 50   - Requires considerable assistance and frequent medical care. 40   - Disabled; requires special care and assistance. 30   - Severely disabled; hospital admission is indicated although death not imminent. 20   - Very sick; hospital admission necessary; active supportive treatment necessary. 10   - Moribund; fatal processes progressing rapidly. 0     - Dead  Karnofsky DA, Abelmann WH, Craver LS and Burchenal JH (1948) The use of the nitrogen mustards in the palliative treatment of carcinoma: with particular reference to bronchogenic carcinoma Cancer 1 634-56  LABORATORY DATA:  Lab Results  Component Value Date   HGB 13.9 09/06/2016   Lab Results  Component Value Date   NA 136 08/28/2016   K 4.0 08/28/2016   CL 99 (L) 08/28/2016   CO2 26 08/28/2016   No results found for: ALT, AST, GGT, ALKPHOS, BILITOT   RADIOGRAPHY: Nm Myocar Multi W/spect W/wall Motion / Ef  Result Date: 08/31/2016 CLINICAL DATA:  Chest pain. Abnormal EKG with left bundle branch block. History of diabetes and hypertension. EXAM: MYOCARDIAL IMAGING WITH SPECT (REST AND  PHARMACOLOGIC-STRESS) GATED LEFT VENTRICULAR WALL MOTION STUDY LEFT VENTRICULAR EJECTION FRACTION TECHNIQUE: Standard myocardial SPECT imaging was performed after resting intravenous injection of 10 mCi Tc-99m tetrofosmin. Subsequently, intravenous infusion of Lexiscan was performed under the supervision of the Cardiology staff. At peak effect of the drug, 30 mCi Tc-99m tetrofosmin was injected intravenously and standard myocardial SPECT imaging was performed. Quantitative gated imaging was also performed to evaluate left ventricular wall motion, and estimate left ventricular ejection fraction. COMPARISON:  None. FINDINGS: Perfusion: Heterogeneous myocardial activity, especially on the rest images. There is generally fixed decreased activity within the septum and inferior wall. A small amount of reversibility in the inferior lateral wall is difficult to exclude. Wall Motion: Septal hypokinesis. Otherwise normal left ventricular wall motion. Left Ventricular Ejection Fraction: 58 % End diastolic volume 83 ml End systolic volume 35 ml IMPRESSION: 1. Heterogeneous myocardial activity. Cannot exclude ischemia within the inferior lateral wall. 2. Normal left ventricular wall motion. 3. Left ventricular ejection fraction 58% 4. Non invasive risk stratification*: Potentially intermediate *2012 Appropriate Use Criteria for Coronary Revascularization Focused Update: J Am Coll Cardiol. 2012;59(9):857-881. http://content.onlinejacc.org/article.aspx?articleid=[email or web address] Electronically Signed   By: William  Veazey M.D.   On: 08/31/2016 13:51   Mm Breast Surgical Specimen  Result Date: 09/05/2016 CLINICAL DATA:  Biopsy proven metastatic carcinoma in a right axillary lymph node. EXAM: SPECIMEN RADIOGRAPH OF THE RIGHT BREAST COMPARISON:  Previous exam(s). FINDINGS: Status post excision of the right axilla. Specimen radiograph was obtained. No radioactive seed or surgical clip is seen in the surgical specimen. Radiograph of a  second container has the radioactive seed. Dr. Wakefield is aware the specimen did not contain the surgical clip but states that an axillary dissection is being performed. IMPRESSION: Specimen radiograph of the right axilla. Electronically Signed   By: Dina  Arceo M.D.   On: 09/05/2016 10:16   Mm Breast Surgical Specimen  Result Date: 09/05/2016 CLINICAL DATA:  Biopsy proven invasive mammary carcinoma and carcinoma in-situ in the right breast. EXAM: SPECIMEN RADIOGRAPH OF THE RIGHT BREAST COMPARISON:  Previous exam(s). FINDINGS: Status post excision of the right breast. Specimen radiograph was obtained. No marker clip is seen in the surgical specimen. Separate container shows presence of the radioactive seed. Dr. Wakefield is aware the biopsy clip is not in the surgical specimen. IMPRESSION: Specimen   radiograph of the right breast. Biopsy clip is not present in the surgical specimen. Electronically Signed   By: Dina  Arceo M.D.   On: 09/05/2016 09:42   Us Rt Radioactive Seed Loc  Result Date: 09/03/2016 CLINICAL DATA:  Patient for preoperative localization prior to right lumpectomy. EXAM: ULTRASOUND GUIDED RADIOACTIVE SEED LOCALIZATION OF THE RIGHT BREAST COMPARISON:  Previous exam(s). FINDINGS: Patient presents for radioactive seed localization prior to right lumpectomy. I met with the patient and we discussed the procedure of seed localization including benefits and alternatives. We discussed the high likelihood of a successful procedure. We discussed the risks of the procedure including infection, bleeding, tissue injury and further surgery. We discussed the low dose of radioactivity involved in the procedure. Informed, written consent was given. The usual time-out protocol was performed immediately prior to the procedure. Using ultrasound guidance, sterile technique, 1% lidocaine and an I-125 radioactive seed, mass within the upper inner right breast was localized using a lateral approach. The  follow-up mammogram images confirm the seed in the expected location and were marked for Dr. Hoxworth. Follow-up survey of the patient confirms presence of the radioactive seed. Order number of I-125 seed:  [id number]. Total activity: 0.257 millicuries Reference Date: 07/20/2016 The patient tolerated the procedure well and was [facility] from the [facility]. She was given instructions regarding seed removal. IMPRESSION: Radioactive seed localization right breast. No apparent complications. Electronically Signed   By: Drew  Davis M.D.   On: 09/03/2016 15:53   Us Rt Radioactive Seed Ea Add Lesion  Result Date: 09/03/2016 CLINICAL DATA:  Preoperative localization for right axillary lymph node removal. EXAM: ULTRASOUND GUIDED RADIOACTIVE SEED LOCALIZATION OF THE RIGHT BREAST COMPARISON:  Previous exam(s). FINDINGS: Patient presents for radioactive seed localization prior to right axillary lymph node removal. I met with the patient and we discussed the procedure of seed localization including benefits and alternatives. We discussed the high likelihood of a successful procedure. We discussed the risks of the procedure including infection, bleeding, tissue injury and further surgery. We discussed the low dose of radioactivity involved in the procedure. Informed, written consent was given. The usual time-out protocol was performed immediately prior to the procedure. Using ultrasound guidance, sterile technique, 1% lidocaine and an I-125 radioactive seed, biopsy marking clip within the right axilla was localized using a lateral approach. The follow-up mammogram images confirm the seed in the expected location and were marked for Dr. Hoxworth. Follow-up survey of the patient confirms presence of the radioactive seed. Order number of I-125 seed:  [id number]. Total activity: 0.257 millicuries Reference Date: 07/20/2016 The patient tolerated the procedure well and was released from the [facility]. She was given  instructions regarding seed removal. IMPRESSION: Radioactive seed localization right breast. No apparent complications. Electronically Signed   By: Drew  Davis M.D.   On: 09/03/2016 15:55   Mm Clip Placement Right  Result Date: 09/03/2016 CLINICAL DATA:  Patient status post radioactive seed localization of right breast mass and right axillary lymph node EXAM: DIAGNOSTIC RIGHT MAMMOGRAM POST ULTRASOUND-GUIDED RADIOACTIVE SEED PLACEMENT COMPARISON:  Previous exam(s). FINDINGS: Mammographic images were obtained following ultrasound-guided radioactive seed placement. These demonstrate the radioactive seed to be located appropriately within the right breast mass upper inner quadrant. Note the ribbon shaped marking clip is located slightly superior and lateral to the biopsied mass as demonstrated on prior mammograms. Additionally a second radioactive seed is located adjacent to the HydroMARK clip within the right axilla. IMPRESSION: Appropriate location of the radioactive seeds. Final Assessment: Post   Procedure Mammograms for Seed Placement Electronically Signed   By: Drew  Davis M.D.   On: 09/03/2016 15:57      IMPRESSION/PLAN: 1. 60 y.o. 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 The patient has undergone breast conservation surgery to the right breast. A course of radiation therapy would be recommended following surgery.  She still has healing to do given her axillary dissection, and indwelling JP drain. She will continue follow up with Dr. Hoxworth to determine the timing for this to be pulled. The patient lives in [place], and is interested in seeking her care closer to home with Dr. Palermo. We will set her up for consultation there. We did however discuss a typical course of radiotherapy would be for about 6 1/2 weeks to the right breast and regional lymph nodes. We discussed the risks, benefits, short, and long term effects of treatment  including delivery and logistics of treatment. She is interested in proceeding when she is medically ready. We will place a referral to Dr. Palermo for the patient to be seen the first week of January 2018, as she has company in town around the Christmas holiday, and most likely won't be ready any sooner due to her JP drain and it's current outpt. She will continue estrogen blockade as per Dr. Gudena.    Alison C. Perkins, PAC      

## 2016-09-18 NOTE — Addendum Note (Signed)
Addendum  created 09/18/16 0720 by Josephine Igo, MD   Anesthesia Event edited, Anesthesia Intra Blocks edited, Sign clinical note

## 2016-09-18 NOTE — Assessment & Plan Note (Addendum)
Right Lumpectomy: IDC grade 2, 1.1 cm, asso DCIS, IDC involves superior and post margin; DCIS close margins Sup and post, 4/12 LN, ER 100%, PR 80%, Ki-67 50%, HER-2 negative ratio 1.07; T1cN1 (stage 2A) Mammaprint: Low Risk  Discussion: I discussed with the patient that Mammaprint is primarily a test to be done on patients with 1-3 positive nodes. It is unclear the significance of this test and 4 positive nodes. Given her age and her performance status being excellent, I recommended systemic chemotherapy with dose dense Adriamycin and Cytoxan 4 followed by Abraxane weekly 12. Abraxane has to be used primarily because she is diabetic and cannot take any steroids.  Plan: 1. Staging CT and bone scans 10/09/2016 2. follow-up 10/10/2016 to discuss the scans and to finalize a treatment 3. Start chemotherapy soon afterwards.  Patient does not want to do any procedures or tests prior to the new year. Once we see her on January 3, we will then set up for port placement, chemotherapy class etc.  Chemotherapy Counseling: I discussed the risks and benefits of chemotherapy including the risks of nausea/ vomiting, risk of infection from low WBC count, fatigue due to chemo or anemia, bruising or bleeding due to low platelets, mouth sores, loss/ change in taste and decreased appetite. Liver and kidney function will be monitored through out chemotherapy as abnormalities in liver and kidney function may be a side effect of treatment. Cardiac dysfunction due to Adriamycin Herceptin and Perjeta were discussed in detail. Risk of permanent bone marrow dysfunction and leukemia due to chemo were also discussed.  Patient may wish to receive her treatment in [place]. I would agree that it would be in her best interest to receive treatment in [place]. We can make those arrangements after she comes back to see me in January.

## 2016-09-19 ENCOUNTER — Encounter: Payer: Self-pay | Admitting: Hematology and Oncology

## 2016-09-19 ENCOUNTER — Ambulatory Visit (HOSPITAL_BASED_OUTPATIENT_CLINIC_OR_DEPARTMENT_OTHER): Payer: BC Managed Care – PPO | Admitting: Hematology and Oncology

## 2016-09-19 DIAGNOSIS — Z170 Estrogen receptor positive status [ER+]: Principal | ICD-10-CM

## 2016-09-19 DIAGNOSIS — C50211 Malignant neoplasm of upper-inner quadrant of right female breast: Secondary | ICD-10-CM | POA: Diagnosis not present

## 2016-09-19 DIAGNOSIS — C773 Secondary and unspecified malignant neoplasm of axilla and upper limb lymph nodes: Secondary | ICD-10-CM | POA: Diagnosis not present

## 2016-09-19 NOTE — Progress Notes (Signed)
Patient Care Team: Cyndy Freeze, MD as PCP - General (Family Medicine)  DIAGNOSIS:  Encounter Diagnosis  Name Primary?  . 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      07/19/2016 Miscellaneous    Mammaprint: Low Risk      09/05/2016 Surgery    Right Lumpectomy: IDC grade 2, 1.1 cm, asso DCIS, IDC involves superior and post margin; DCIS close margins Sup and post, 4/12 LN, T1cN1 (stage 2A)       CHIEF COMPLIANT: Follow-up after right lumpectomy  INTERVAL HISTORY: Sandra Compton is a 60 year old with above-mentioned history of right breast cancer who underwent lumpectomy and is here today to discuss the results. On the lumpectomy the pathology report suggests that there was issues with superior-posterior margins. But according to the the surgeons, dose margins have been excised. Patient does not need repeat excision. She continues to have a drain which is still draining about 30 mL and patient is very frustrated about it. She was found to have 4 out of 12 nodes that were positive. She is here to discuss the adjuvant treatment plan.  REVIEW OF SYSTEMS:   Constitutional: Denies fevers, chills or abnormal weight loss Eyes: Denies blurriness of vision Ears, nose, mouth, throat, and face: Denies mucositis or sore throat Respiratory: Denies cough, dyspnea or wheezes Cardiovascular: Denies palpitation, chest discomfort Gastrointestinal:  Denies nausea, heartburn or change in bowel habits Skin: Denies abnormal skin rashes Lymphatics:  Denies new lymphadenopathy or easy bruising Neurological:Denies numbness, tingling or new weaknesses Behavioral/Psych: Mood is stable, no new changes  Extremities: No lower extremity edema Breast: Recent lumpectomy All other systems were reviewed with the patient and are negative.  I have reviewed the past medical history, past surgical history, social history and family history with the patient and they are unchanged from previous note.  ALLERGIES:  is allergic to codeine and penicillins.  MEDICATIONS:  Current Outpatient Prescriptions  Medication Sig Dispense Refill  . anastrozole (ARIMIDEX) 1 MG tablet Take 1 tablet (1 mg total) by mouth daily. 90 tablet 3  . BIOGAIA PROBIOTIC (BIOGAIA PROBIOTIC) LIQD Take by mouth daily at 8 pm.    . carvedilol (COREG) 3.125 MG tablet Take 1 tablet (3.125 mg total) by mouth 2 (two) times daily. 60 tablet 6  . dapagliflozin propanediol (FARXIGA) 5 MG TABS tablet Take 5 mg by mouth daily.    Marland Kitchen glyBURIDE-metformin (GLUCOVANCE) 5-500 MG tablet   0  . HYDROcodone-acetaminophen (NORCO) 7.5-325 MG tablet Take 1-2 tablets by mouth once as needed (for pain score 1-4). 20 tablet 0  . sertraline (ZOLOFT) 100 MG tablet   0  . triamterene-hydrochlorothiazide (MAXZIDE-25) 37.5-25 MG tablet   0   No current facility-administered medications for this visit.     PHYSICAL EXAMINATION: ECOG PERFORMANCE STATUS: 1 - Symptomatic but completely ambulatory  Vitals:   09/19/16 1529  BP: (!) 170/75  Pulse: 92  Resp: 18  Temp: 97.8 F (36.6 C)   Filed Weights   09/19/16 1529  Weight: 193 lb 8 oz (87.8 kg)  GENERAL:alert, no distress and comfortable SKIN: skin color, texture, turgor are normal, no rashes or significant lesions EYES: normal, Conjunctiva are pink and non-injected, sclera clear OROPHARYNX:no exudate, no erythema and lips, buccal mucosa, and tongue normal  NECK: supple, thyroid normal size, non-tender, without nodularity LYMPH:  no palpable  lymphadenopathy in the cervical, axillary or inguinal LUNGS: clear to auscultation and percussion with normal breathing effort HEART: regular rate & rhythm and no murmurs and no lower extremity edema ABDOMEN:abdomen soft, non-tender and normal bowel sounds MUSCULOSKELETAL:no cyanosis of digits and no clubbing  NEURO: alert & oriented x 3 with fluent speech, no focal motor/sensory deficits EXTREMITIES: No lower extremity edema  LABORATORY DATA:  I have reviewed the data as listed   Chemistry      Component Value Date/Time   NA 136 08/28/2016 1626   K 4.0 08/28/2016 1626   CL 99 (L) 08/28/2016 1626   CO2 26 08/28/2016 1626   BUN 11 08/28/2016 1626   CREATININE 0.77 08/28/2016 1626      Component Value Date/Time   CALCIUM 9.2 08/28/2016 1626       Lab Results  Component Value Date   HGB 13.9 09/06/2016    ASSESSMENT & PLAN:  Breast cancer of upper-inner quadrant of right female breast (HCC) Right Lumpectomy: IDC grade 2, 1.1 cm, asso DCIS, IDC involves superior and post margin; DCIS close margins Sup and post, 4/12 LN, ER 100%, PR 80%, Ki-67 50%, HER-2 negative ratio 1.07; T1cN1 (stage 2A) Mammaprint: Low Risk  Discussion: I discussed with the patient that Mammaprint is primarily a test to be done on patients with 1-3 positive nodes. It is unclear the significance of this test and 4 positive nodes. Given her age and her performance status being excellent, I recommended systemic chemotherapy with dose dense Adriamycin and Cytoxan 4 followed by Abraxane weekly 12. Abraxane has to be used primarily because she is diabetic and cannot take any steroids.  Plan: 1. Staging CT and bone scans 10/09/2016 2. follow-up 10/10/2016 to discuss the scans and to finalize a treatment 3. Start chemotherapy soon afterwards.  Patient does not want to do any procedures or tests prior to the new year. Once we see her on January 3, we will then set up for port placement, chemotherapy class  etc.  Chemotherapy Counseling: I discussed the risks and benefits of chemotherapy including the risks of nausea/ vomiting, risk of infection from low WBC count, fatigue due to chemo or anemia, bruising or bleeding due to low platelets, mouth sores, loss/ change in taste and decreased appetite. Liver and kidney function will be monitored through out chemotherapy as abnormalities in liver and kidney function may be a side effect of treatment. Cardiac dysfunction due to Adriamycin Herceptin and Perjeta were discussed in detail. Risk of permanent bone marrow dysfunction and leukemia due to chemo were also discussed.  Patient may wish to receive her treatment in [place]. I would agree that it would be in her best interest to receive treatment in [place]. We can make those arrangements after she comes back to see me in January.    Orders Placed This Encounter  Procedures  . CT Chest W Contrast    Standing Status:   Future    Standing Expiration Date:   09/19/2017    Order Specific Question:   If indicated for the ordered procedure, I authorize the administration of contrast media per Radiology protocol    Answer:   Yes    Order Specific Question:  Reason for Exam (SYMPTOM  OR DIAGNOSIS REQUIRED)    Answer:   stage 2 b breast cancer    Order Specific Question:   Preferred imaging location?    Answer:   [facility]  . CT Abdomen Pelvis W Contrast    Standing Status:   Future    Standing Expiration Date:   09/19/2017    Order Specific Question:   If indicated for the ordered procedure, I authorize the administration of contrast media per Radiology protocol    Answer:   Yes    Order Specific Question:   Reason for Exam (SYMPTOM  OR DIAGNOSIS REQUIRED)    Answer:   Stage 2 B breast cancer staging 4 positive lymph nodes    Order Specific Question:   Preferred imaging location?    Answer:   [facility]    Standing Status:   Future    Standing  Expiration Date:   09/19/2017    Order Specific Question:   Reason for Exam (SYMPTOM  OR DIAGNOSIS REQUIRED)    Answer:   Stage 2 B breast cancer    Order Specific Question:   Preferred imaging location?    Answer:   [facility]    Order Specific Question:   If indicated for the ordered procedure, I authorize the administration of a radiopharmaceutical per Radiology protocol    Answer:   Yes   The patient has a good understanding of the overall plan. she agrees with it. she will call with any problems that may develop before the next visit here.   Rulon Eisenmenger, MD 09/19/16

## 2016-09-21 ENCOUNTER — Telehealth: Payer: Self-pay | Admitting: *Deleted

## 2016-09-21 NOTE — Telephone Encounter (Signed)
CALLED PATIENT TO REMIND OF APPT. FOR 09-24-16, LVM FOR A RETURN CALL

## 2016-09-24 ENCOUNTER — Ambulatory Visit: Payer: BLUE CROSS/BLUE SHIELD | Admitting: Radiation Oncology

## 2016-09-24 ENCOUNTER — Ambulatory Visit: Payer: Self-pay | Admitting: Radiation Oncology

## 2016-09-24 ENCOUNTER — Telehealth: Payer: Self-pay | Admitting: Hematology and Oncology

## 2016-09-24 NOTE — Telephone Encounter (Signed)
lvmto inform pt of 10/10/16 appt date/time per LOS

## 2016-09-26 ENCOUNTER — Telehealth: Payer: Self-pay | Admitting: Hematology and Oncology

## 2016-09-26 ENCOUNTER — Telehealth: Payer: Self-pay | Admitting: *Deleted

## 2016-09-26 NOTE — Telephone Encounter (Signed)
Faxed pt medical records to [facility].  Mariann Laster will call pt with appt.

## 2016-09-26 NOTE — Telephone Encounter (Signed)
Return pt called and discussed future plans of treatment. Confirmed appts for CT/Bone scan on 10/09/16. Pt wishes to have chemo and xrt in [place]. Request sent for referral to be made urgently for med/rad onc in [place]. Discussed with pt she will be receiving a call for appts. Gave emotional support and encouragement. Denies further needs at this time. Encourage pt to call with questions or concerns.

## 2016-09-27 ENCOUNTER — Ambulatory Visit: Payer: Self-pay | Admitting: General Surgery

## 2016-09-27 NOTE — Progress Notes (Signed)
Pt is being scheduled for preop appt; please place surgical orders in epic. Thanks.  

## 2016-10-02 ENCOUNTER — Encounter (HOSPITAL_COMMUNITY)
Admission: RE | Admit: 2016-10-02 | Discharge: 2016-10-02 | Disposition: A | Discharge status: Home / Self Care | Payer: BC Managed Care – PPO | Source: Ambulatory Visit | Attending: General Surgery | Admitting: General Surgery

## 2016-10-02 ENCOUNTER — Encounter (HOSPITAL_COMMUNITY): Payer: Self-pay

## 2016-10-02 DIAGNOSIS — I447 Left bundle-branch block, unspecified: Secondary | ICD-10-CM | POA: Diagnosis not present

## 2016-10-02 DIAGNOSIS — I10 Essential (primary) hypertension: Secondary | ICD-10-CM | POA: Diagnosis not present

## 2016-10-02 DIAGNOSIS — Z01812 Encounter for preprocedural laboratory examination: Secondary | ICD-10-CM | POA: Diagnosis not present

## 2016-10-02 DIAGNOSIS — C50211 Malignant neoplasm of upper-inner quadrant of right female breast: Secondary | ICD-10-CM | POA: Diagnosis not present

## 2016-10-02 DIAGNOSIS — E119 Type 2 diabetes mellitus without complications: Secondary | ICD-10-CM | POA: Insufficient documentation

## 2016-10-02 HISTORY — DX: Other injury of unspecified body region, initial encounter: T14.8XXA

## 2016-10-02 HISTORY — DX: Irritant contact dermatitis due friction or contact with other specified body fluids: L24.A9

## 2016-10-02 HISTORY — DX: Personal history of other medical treatment: Z92.89

## 2016-10-02 LAB — CBC
HEMATOCRIT: 44 % (ref 36.0–46.0)
Hemoglobin: 14.6 g/dL (ref 12.0–15.0)
MCH: 28 pg (ref 26.0–34.0)
MCHC: 33.2 g/dL (ref 30.0–36.0)
MCV: 84.5 fL (ref 78.0–100.0)
Platelets: 273 10*3/uL (ref 150–400)
RBC: 5.21 MIL/uL — ABNORMAL HIGH (ref 3.87–5.11)
RDW: 14.1 % (ref 11.5–15.5)
WBC: 12.2 10*3/uL — ABNORMAL HIGH (ref 4.0–10.5)

## 2016-10-02 LAB — BASIC METABOLIC PANEL
ANION GAP: 8 (ref 5–15)
BUN: 12 mg/dL (ref 6–20)
CO2: 30 mmol/L (ref 22–32)
Calcium: 9.2 mg/dL (ref 8.9–10.3)
Chloride: 100 mmol/L — ABNORMAL LOW (ref 101–111)
Creatinine, Ser: 0.72 mg/dL (ref 0.44–1.00)
GFR calc Af Amer: >60 mL/min (ref >60)
GLUCOSE: 212 mg/dL — AB (ref 65–99)
POTASSIUM: 4.8 mmol/L (ref 3.5–5.1)
Sodium: 138 mmol/L (ref 135–145)

## 2016-10-02 LAB — GLUCOSE, CAPILLARY: Glucose-Capillary: 271 mg/dL — ABNORMAL HIGH (ref 65–99)

## 2016-10-02 NOTE — Patient Instructions (Signed)
Sandra Compton  10/02/2016   Your procedure is scheduled on: 10-09-16  Report to [facility] Main  Entrance take [facility]  elevators to 3rd floor to  [place] at   [place]  AM.  Call this number if you have problems the morning of surgery [phone]   Remember: ONLY 1 PERSON MAY GO WITH YOU TO SHORT STAY TO GET  READY MORNING OF [place].  Do not eat food or drink liquids :After Midnight.     Take these medicines the morning of surgery with A SIP OF WATER: Anastozole. Carvedilol. Sertraline DO NOT TAKE ANY DIABETIC MEDICATIONS DAY OF YOUR SURGERY                               You may not have any metal on your body including hair pins and              piercings  Do not wear jewelry, make-up, lotions, powders or perfumes, deodorant             Do not wear nail polish.  Do not shave  48 hours prior to surgery.              Men may shave face and neck.   Do not bring valuables to the hospital. [place].  Contacts, dentures or bridgework may not be worn into surgery.  Leave suitcase in the car. After surgery it may be brought to your room.     Patients discharged the day of surgery will not be allowed to drive home.  Name and phone number of your driver:Randy-spouse [phone]  cell  Special Instructions: N/A              Please read over the following fact sheets you were given: _____________________________________________________________________             [facility] - Preparing for Surgery Before surgery, you can play an important role.  Because skin is not sterile, your skin needs to be as free of germs as possible.  You can reduce the number of germs on your skin by washing with CHG (chlorahexidine gluconate) soap before surgery.  CHG is an antiseptic cleaner which kills germs and bonds with the skin to continue killing germs even after washing. Please DO NOT use if you have an allergy to CHG  or antibacterial soaps.  If your skin becomes reddened/irritated stop using the CHG and inform your nurse when you arrive at Short Stay. Do not shave (including legs and underarms) for at least 48 hours prior to the first CHG shower.  You may shave your face/neck. Please follow these instructions carefully:  1.  Shower with CHG Soap the night before surgery and the  morning of Surgery.  2.  If you choose to wash your hair, wash your hair first as usual with your  normal  shampoo.  3.  After you shampoo, rinse your hair and body thoroughly to remove the  shampoo.                           4.  Use CHG as you would any other liquid soap.  You can apply chg  directly  to the skin and wash                       Gently with a scrungie or clean washcloth.  5.  Apply the CHG Soap to your body ONLY FROM THE NECK DOWN.   Do not use on face/ open                           Wound or open sores. Avoid contact with eyes, ears mouth and genitals (private parts).                       Wash face,  Genitals (private parts) with your normal soap.             6.  Wash thoroughly, paying special attention to the area where your surgery  will be performed.  7.  Thoroughly rinse your body with warm water from the neck down.  8.  DO NOT shower/wash with your normal soap after using and rinsing off  the CHG Soap.                9.  Pat yourself dry with a clean towel.            10.  Wear clean pajamas.            11.  Place clean sheets on your bed the night of your first shower and do not  sleep with pets. Day of Surgery : Do not apply any lotions/deodorants the morning of surgery.  Please wear clean clothes to the hospital/surgery center.  FAILURE TO FOLLOW THESE INSTRUCTIONS MAY RESULT IN THE CANCELLATION OF YOUR SURGERY ___________ How to Manage Your Diabetes Before and After Surgery  Why is it important to control my blood sugar before and after surgery? . Improving blood sugar levels before and after surgery  helps healing and can limit problems. . A way of improving blood sugar control is eating a healthy diet by: o  Eating less sugar and carbohydrates o  Increasing activity/exercise o  Talking with your doctor about reaching your blood sugar goals . High blood sugars (greater than 180 mg/dL) can raise your risk of infections and slow your recovery, so you will need to focus on controlling your diabetes during the weeks before surgery. . Make sure that the doctor who takes care of your diabetes knows about your planned surgery including the date and location.  How do I manage my blood sugar before surgery? . Check your blood sugar at least 4 times a day, starting 2 days before surgery, to make sure that the level is not too high or low. o Check your blood sugar the morning of your surgery when you wake up and every 2 hours until you get to the Short Stay unit. . If your blood sugar is less than 70 mg/dL, you will need to treat for low blood sugar: o Do not take insulin. o Treat a low blood sugar (less than 70 mg/dL) with  cup of clear juice (cranberry or apple), 4 glucose tablets, OR glucose gel. o Recheck blood sugar in 15 minutes after treatment (to make sure it is greater than 70 mg/dL). If your blood sugar is not greater than 70 mg/dL on recheck, call [phone] for further instructions. . Report your blood sugar to the short stay nurse when you get to Short Stay.  . If  you are admitted to the hospital after surgery: o Your blood sugar will be checked by the staff and you will probably be given insulin after surgery (instead of oral diabetes medicines) to make sure you have good blood sugar levels. o The goal for blood sugar control after surgery is 80-180 mg/dL.   WHAT DO I DO ABOUT MY DIABETES MEDICATION?  Marland Kitchen Do not take oral diabetes medicines (pills) the morning of surgery.   Patient Signature:  Date:   Nurse Signature:  Date:   Reviewed and Endorsed by [facility] Patient  Education Committee, August 2015

## 2016-10-02 NOTE — Pre-Procedure Instructions (Signed)
EKG,ECho 11'17 Epic. CBG = 271, pt. Has eaten and taken meds this AM prior to PAT visit.

## 2016-10-03 LAB — HEMOGLOBIN A1C
HEMOGLOBIN A1C: 8.4 % — AB (ref 4.8–5.6)
MEAN PLASMA GLUCOSE: 194 mg/dL

## 2016-10-03 NOTE — Progress Notes (Signed)
10-03-16 1430  Hgb  A1C level=8.4, with PAT labs done 10-02-16 -pt is known diabetic- will be following Diabetes protocol.

## 2016-10-05 NOTE — H&P (Signed)
History of Present Illness Sandra Compton T. [name] MD; 09/27/2016 2:46 PM) The patient is a 60 year old female who presents with breast cancer. She returns three weeks status post lumpectomy and axillary dissection on the right for stage II cancer of the breast. Surgery date 09/05/2016. At the time of her sentinel lymph node biopsy there were several palpable obviously abnormal lymph nodes and I proceeded with full axillary dissection. She is doing well without any pain or other concerns.  We had previously reviewed her pathology. She had a 1.1 cm tumor. Margins are negative. Medial margin was within 1 mm but microscopically clear and I do not think needs reexcision. She had 2 out of 3 sentinel lymph nodes positive for metastatic cancer and had an additional 12 lymph nodes removed at the time of her axillary dissection with 2 of these positive for a total of 4 out of 15 lymph nodes. JP drainage is still greater than 20 cc per day.  She is doing okay but continues to have as much as 40 cc per day of drainage in her JP. She will need chemotherapy which will be given in [place]. She will need a Port-A-Cath.    Allergies Bary Castilla [place], [place]; 09/27/2016 2:17 PM) Codeine Phosphate *ANALGESICS - OPIOID*  Penicillins  No Known Drug Allergies 09/27/2016 (Marked as Inactive)  Medication History [name], CMA; 09/27/2016 2:17 PM) Anastrozole (1MG Tablet, Oral) Active. Probiotic Product (Oral) Active. Farxiga (5MG Tablet, Oral) Active. Triamterene-HCTZ (37.5-25MG Tablet, Oral) Active. Sertraline HCl (100MG Tablet, Oral) Active. GlyBURIDE-MetFORMIN (5-500MG Tablet, Oral) Active. Medications Reconciled  Vitals ([name] CMA; 09/27/2016 2:18 PM) 09/27/2016 2:18 PM Weight: 188 lb Height: 62in Body Surface Area: 1.86 m Body Mass Index: 34.39 kg/m  Temp.: 66F  Pulse: 98 (Regular)  BP: 132/78 (Sitting, Left Arm, Standard)       Physical Exam Sandra Compton T.  Brooklynne Pereida MD; 09/27/2016 2:46 PM) The physical exam findings are as follows: Note:General: Appears well Lungs: No wheezing or increased work of breathing Incisions: Healing nicely without infection. JP drainage is clear. Neuro: Alert and fully oriented. Gait normal.    Assessment & Plan Sandra Compton T. Braiden Rodman MD; 09/27/2016 2:48 PM) STAGE II BREAST CANCER, RIGHT (C50.911) Impression: Doing well postop right breast lumpectomy and axillary dissection for stage II cancer of the right breast, ER/PR positive and HER-2 negative, 1.1 cm primary with negative margins and 4 of 15 lymph nodes positive. Drain will be left in place for now we will recheck in 1 week. She is going to get chemotherapy in [place]. We will plan Port-A-Cath placement I will try to arrange and therefore her convenience. We discussed the procedure including its indications and nature and risks of anesthetic complications, bleeding, infection, pneumothorax and long-term risks of infection, catheter occlusion or displacement and DVT. All her questions were answered. Current Plans Patient was instructed to follow-up in one week. Port-A-Cath placement under general anesthesia as an outpatient

## 2016-10-09 ENCOUNTER — Encounter (HOSPITAL_COMMUNITY): Payer: Self-pay

## 2016-10-09 ENCOUNTER — Ambulatory Visit (HOSPITAL_COMMUNITY)
Admission: RE | Admit: 2016-10-09 | Discharge: 2016-10-09 | Disposition: A | Discharge status: Home / Self Care | Payer: BC Managed Care – PPO | Source: Ambulatory Visit | Attending: General Surgery | Admitting: General Surgery

## 2016-10-09 ENCOUNTER — Ambulatory Visit (HOSPITAL_COMMUNITY): Payer: BC Managed Care – PPO | Admitting: Registered Nurse

## 2016-10-09 ENCOUNTER — Encounter (HOSPITAL_COMMUNITY)
Admission: RE | Admit: 2016-10-09 | Discharge: 2016-10-09 | Disposition: A | Discharge status: Home / Self Care | Payer: BC Managed Care – PPO | Source: Ambulatory Visit | Attending: Hematology and Oncology | Admitting: Hematology and Oncology

## 2016-10-09 ENCOUNTER — Ambulatory Visit (HOSPITAL_COMMUNITY): Payer: BC Managed Care – PPO

## 2016-10-09 ENCOUNTER — Encounter (HOSPITAL_COMMUNITY): Payer: Self-pay | Admitting: *Deleted

## 2016-10-09 ENCOUNTER — Encounter (HOSPITAL_COMMUNITY)
Admission: RE | Disposition: A | Discharge status: Home / Self Care | Payer: Self-pay | Source: Ambulatory Visit | Attending: General Surgery

## 2016-10-09 DIAGNOSIS — F329 Major depressive disorder, single episode, unspecified: Secondary | ICD-10-CM | POA: Insufficient documentation

## 2016-10-09 DIAGNOSIS — Z880 Allergy status to penicillin: Secondary | ICD-10-CM | POA: Diagnosis not present

## 2016-10-09 DIAGNOSIS — E119 Type 2 diabetes mellitus without complications: Secondary | ICD-10-CM | POA: Diagnosis not present

## 2016-10-09 DIAGNOSIS — Z170 Estrogen receptor positive status [ER+]: Secondary | ICD-10-CM | POA: Diagnosis not present

## 2016-10-09 DIAGNOSIS — M1990 Unspecified osteoarthritis, unspecified site: Secondary | ICD-10-CM | POA: Insufficient documentation

## 2016-10-09 DIAGNOSIS — C773 Secondary and unspecified malignant neoplasm of axilla and upper limb lymph nodes: Secondary | ICD-10-CM | POA: Diagnosis not present

## 2016-10-09 DIAGNOSIS — Z885 Allergy status to narcotic agent status: Secondary | ICD-10-CM | POA: Insufficient documentation

## 2016-10-09 DIAGNOSIS — Z7984 Long term (current) use of oral hypoglycemic drugs: Secondary | ICD-10-CM | POA: Insufficient documentation

## 2016-10-09 DIAGNOSIS — R918 Other nonspecific abnormal finding of lung field: Secondary | ICD-10-CM | POA: Diagnosis not present

## 2016-10-09 DIAGNOSIS — Z5309 Procedure and treatment not carried out because of other contraindication: Secondary | ICD-10-CM | POA: Diagnosis not present

## 2016-10-09 DIAGNOSIS — M129 Arthropathy, unspecified: Secondary | ICD-10-CM | POA: Insufficient documentation

## 2016-10-09 DIAGNOSIS — I10 Essential (primary) hypertension: Secondary | ICD-10-CM | POA: Diagnosis not present

## 2016-10-09 DIAGNOSIS — C50911 Malignant neoplasm of unspecified site of right female breast: Secondary | ICD-10-CM | POA: Insufficient documentation

## 2016-10-09 DIAGNOSIS — C50211 Malignant neoplasm of upper-inner quadrant of right female breast: Secondary | ICD-10-CM

## 2016-10-09 DIAGNOSIS — Z09 Encounter for follow-up examination after completed treatment for conditions other than malignant neoplasm: Secondary | ICD-10-CM

## 2016-10-09 HISTORY — PX: IR GENERIC HISTORICAL: IMG1180011

## 2016-10-09 HISTORY — PX: PORTACATH PLACEMENT: SHX2246

## 2016-10-09 LAB — CBC
HEMATOCRIT: 41.5 % (ref 36.0–46.0)
Hemoglobin: 13.8 g/dL (ref 12.0–15.0)
MCH: 28.2 pg (ref 26.0–34.0)
MCHC: 33.3 g/dL (ref 30.0–36.0)
MCV: 84.7 fL (ref 78.0–100.0)
PLATELETS: 226 10*3/uL (ref 150–400)
RBC: 4.9 MIL/uL (ref 3.87–5.11)
RDW: 14.3 % (ref 11.5–15.5)
WBC: 11.9 10*3/uL — ABNORMAL HIGH (ref 4.0–10.5)

## 2016-10-09 LAB — PROTIME-INR
INR: 0.99
Prothrombin Time: 13.1 seconds (ref 11.4–15.2)

## 2016-10-09 LAB — GLUCOSE, CAPILLARY: Glucose-Capillary: 223 mg/dL — ABNORMAL HIGH (ref 65–99)

## 2016-10-09 SURGERY — INSERTION, TUNNELED CENTRAL VENOUS DEVICE, WITH PORT
Anesthesia: General | Site: Neck

## 2016-10-09 MED ORDER — TECHNETIUM TC 99M MEDRONATE IV KIT
21.1000 | PACK | Freq: Once | INTRAVENOUS | Status: AC | PRN
  Administered 2016-10-09: 21.1 via INTRAVENOUS

## 2016-10-09 MED ORDER — LIDOCAINE-EPINEPHRINE (PF) 2 %-1:200000 IJ SOLN
INTRAMUSCULAR | Status: AC
  Filled 2016-10-09: qty 20

## 2016-10-09 MED ORDER — HEPARIN SOD (PORK) LOCK FLUSH 100 UNIT/ML IV SOLN
INTRAVENOUS | Status: AC
  Filled 2016-10-09: qty 5

## 2016-10-09 MED ORDER — BACITRACIN-NEOMYCIN-POLYMYXIN 400-5-5000 EX OINT
TOPICAL_OINTMENT | CUTANEOUS | Status: AC
  Filled 2016-10-09: qty 1

## 2016-10-09 MED ORDER — EPHEDRINE 5 MG/ML INJ
INTRAVENOUS | Status: AC
  Filled 2016-10-09: qty 10

## 2016-10-09 MED ORDER — FENTANYL CITRATE (PF) 100 MCG/2ML IJ SOLN
INTRAMUSCULAR | Status: AC | PRN
  Administered 2016-10-09: 25 ug via INTRAVENOUS
  Administered 2016-10-09: 50 ug via INTRAVENOUS
  Administered 2016-10-09: 25 ug via INTRAVENOUS

## 2016-10-09 MED ORDER — MIDAZOLAM HCL 2 MG/2ML IJ SOLN
INTRAMUSCULAR | Status: AC | PRN
  Administered 2016-10-09 (×4): 1 mg via INTRAVENOUS

## 2016-10-09 MED ORDER — ONDANSETRON HCL 4 MG/2ML IJ SOLN
INTRAMUSCULAR | Status: DC | PRN
  Administered 2016-10-09: 4 mg via INTRAVENOUS

## 2016-10-09 MED ORDER — VANCOMYCIN HCL IN DEXTROSE 1-5 GM/200ML-% IV SOLN
1000.0000 mg | INTRAVENOUS | Status: AC
  Administered 2016-10-09: 1000 mg via INTRAVENOUS
  Filled 2016-10-09: qty 200

## 2016-10-09 MED ORDER — MIDAZOLAM HCL 2 MG/2ML IJ SOLN
INTRAMUSCULAR | Status: AC
  Filled 2016-10-09: qty 2

## 2016-10-09 MED ORDER — CHLORHEXIDINE GLUCONATE CLOTH 2 % EX PADS: 6.0000 | MEDICATED_PAD | Freq: Once | CUTANEOUS | Status: DC

## 2016-10-09 MED ORDER — BUPIVACAINE HCL (PF) 0.25 % IJ SOLN
INTRAMUSCULAR | Status: AC
  Filled 2016-10-09: qty 30

## 2016-10-09 MED ORDER — ONDANSETRON HCL 4 MG/2ML IJ SOLN
INTRAMUSCULAR | Status: AC
  Filled 2016-10-09: qty 2

## 2016-10-09 MED ORDER — IOPAMIDOL (ISOVUE-300) INJECTION 61%
INTRAVENOUS | Status: AC
  Administered 2016-10-09: 10 mL via INTRAVENOUS
  Filled 2016-10-09: qty 50

## 2016-10-09 MED ORDER — DEXAMETHASONE SODIUM PHOSPHATE 10 MG/ML IJ SOLN
INTRAMUSCULAR | Status: AC
  Filled 2016-10-09: qty 1

## 2016-10-09 MED ORDER — IOPAMIDOL (ISOVUE-300) INJECTION 61%
INTRAVENOUS | Status: AC
  Administered 2016-10-09: 100 mL
  Filled 2016-10-09: qty 100

## 2016-10-09 MED ORDER — PROMETHAZINE HCL 25 MG/ML IJ SOLN: 6.2500 mg | INTRAMUSCULAR | Status: DC | PRN

## 2016-10-09 MED ORDER — 0.9 % SODIUM CHLORIDE (POUR BTL) OPTIME
TOPICAL | Status: DC | PRN
  Administered 2016-10-09: 1000 mL

## 2016-10-09 MED ORDER — KETOROLAC TROMETHAMINE 30 MG/ML IJ SOLN: 30.0000 mg | Freq: Once | INTRAMUSCULAR | Status: DC | PRN

## 2016-10-09 MED ORDER — PROPOFOL 10 MG/ML IV BOLUS
INTRAVENOUS | Status: DC | PRN
  Administered 2016-10-09: 200 mg via INTRAVENOUS

## 2016-10-09 MED ORDER — CIPROFLOXACIN IN D5W 400 MG/200ML IV SOLN
INTRAVENOUS | Status: AC
Start: 2016-10-09 — End: 2016-10-09
  Filled 2016-10-09: qty 200

## 2016-10-09 MED ORDER — LACTATED RINGERS IV SOLN: INTRAVENOUS | Status: DC

## 2016-10-09 MED ORDER — FENTANYL CITRATE (PF) 100 MCG/2ML IJ SOLN
INTRAMUSCULAR | Status: AC
  Filled 2016-10-09: qty 4

## 2016-10-09 MED ORDER — PROPOFOL 10 MG/ML IV BOLUS
INTRAVENOUS | Status: AC
  Filled 2016-10-09: qty 20

## 2016-10-09 MED ORDER — CIPROFLOXACIN IN D5W 400 MG/200ML IV SOLN
400.0000 mg | INTRAVENOUS | Status: AC
  Administered 2016-10-09: 400 mg via INTRAVENOUS

## 2016-10-09 MED ORDER — LIDOCAINE 2% (20 MG/ML) 5 ML SYRINGE
INTRAMUSCULAR | Status: AC
  Filled 2016-10-09: qty 5

## 2016-10-09 MED ORDER — FENTANYL CITRATE (PF) 100 MCG/2ML IJ SOLN
INTRAMUSCULAR | Status: AC
  Filled 2016-10-09: qty 2

## 2016-10-09 MED ORDER — LACTATED RINGERS IV SOLN
INTRAVENOUS | Status: DC | PRN
  Administered 2016-10-09: 07:00:00 via INTRAVENOUS

## 2016-10-09 MED ORDER — LIDOCAINE-EPINEPHRINE (PF) 2 %-1:200000 IJ SOLN
INTRAMUSCULAR | Status: DC | PRN
  Administered 2016-10-09: 10 mL via INTRADERMAL

## 2016-10-09 MED ORDER — MIDAZOLAM HCL 5 MG/5ML IJ SOLN
INTRAMUSCULAR | Status: DC | PRN
  Administered 2016-10-09 (×2): 1 mg via INTRAVENOUS

## 2016-10-09 MED ORDER — FENTANYL CITRATE (PF) 100 MCG/2ML IJ SOLN
INTRAMUSCULAR | Status: DC | PRN
  Administered 2016-10-09 (×2): 50 ug via INTRAVENOUS

## 2016-10-09 MED ORDER — IOPAMIDOL (ISOVUE-300) INJECTION 61%
30.0000 mL | Freq: Once | INTRAVENOUS | Status: AC | PRN
Start: 2016-10-09 — End: 2016-10-09
  Administered 2016-10-09: 30 mL via ORAL

## 2016-10-09 MED ORDER — DEXAMETHASONE SODIUM PHOSPHATE 10 MG/ML IJ SOLN
INTRAMUSCULAR | Status: DC | PRN
  Administered 2016-10-09: 10 mg via INTRAVENOUS

## 2016-10-09 MED ORDER — BUPIVACAINE HCL (PF) 0.25 % IJ SOLN
INTRAMUSCULAR | Status: DC | PRN
  Administered 2016-10-09: 3 mL

## 2016-10-09 MED ORDER — FENTANYL CITRATE (PF) 100 MCG/2ML IJ SOLN: 25.0000 ug | INTRAMUSCULAR | Status: DC | PRN

## 2016-10-09 MED ORDER — IOPAMIDOL (ISOVUE-300) INJECTION 61%
INTRAVENOUS | Status: AC
  Administered 2016-10-09: 30 mL via ORAL
  Filled 2016-10-09: qty 30

## 2016-10-09 MED ORDER — IOPAMIDOL (ISOVUE-300) INJECTION 61%
50.0000 mL | Freq: Once | INTRAVENOUS | Status: AC | PRN
  Administered 2016-10-09: 10 mL via INTRAVENOUS

## 2016-10-09 MED ORDER — LIDOCAINE 2% (20 MG/ML) 5 ML SYRINGE
INTRAMUSCULAR | Status: DC | PRN
  Administered 2016-10-09: 100 mg via INTRAVENOUS

## 2016-10-09 MED ORDER — HEPARIN SODIUM (PORCINE) 5000 UNIT/ML IJ SOLN
Freq: Once | INTRAMUSCULAR | Status: DC
  Filled 2016-10-09: qty 1.2

## 2016-10-09 MED ORDER — MIDAZOLAM HCL 2 MG/2ML IJ SOLN
INTRAMUSCULAR | Status: AC
  Filled 2016-10-09: qty 6

## 2016-10-09 MED ORDER — EPHEDRINE SULFATE-NACL 50-0.9 MG/10ML-% IV SOSY
PREFILLED_SYRINGE | INTRAVENOUS | Status: DC | PRN
  Administered 2016-10-09 (×4): 5 mg via INTRAVENOUS

## 2016-10-09 SURGICAL SUPPLY — 32 items
BAG DECANTER FOR FLEXI CONT (MISCELLANEOUS) ×2 IMPLANT
BENZOIN TINCTURE PRP APPL 2/3 (GAUZE/BANDAGES/DRESSINGS) IMPLANT
BLADE SURG 15 STRL LF DISP TIS (BLADE) ×1 IMPLANT
BLADE SURG 15 STRL SS (BLADE) ×1
CHLORAPREP W/TINT 26ML (MISCELLANEOUS) ×2 IMPLANT
COVER SURGICAL LIGHT HANDLE (MISCELLANEOUS) IMPLANT
DECANTER SPIKE VIAL GLASS SM (MISCELLANEOUS) ×2 IMPLANT
DERMABOND ADVANCED (GAUZE/BANDAGES/DRESSINGS) ×1
DERMABOND ADVANCED .7 DNX12 (GAUZE/BANDAGES/DRESSINGS) ×1 IMPLANT
DRAPE C-ARM 42X120 X-RAY (DRAPES) ×2 IMPLANT
DRAPE LAPAROSCOPIC ABDOMINAL (DRAPES) ×2 IMPLANT
ELECT PENCIL ROCKER SW 15FT (MISCELLANEOUS) ×2 IMPLANT
ELECT REM PT RETURN 9FT ADLT (ELECTROSURGICAL) ×2
ELECTRODE REM PT RTRN 9FT ADLT (ELECTROSURGICAL) ×1 IMPLANT
GAUZE SPONGE 4X4 12PLY STRL (GAUZE/BANDAGES/DRESSINGS) IMPLANT
GAUZE SPONGE 4X4 16PLY XRAY LF (GAUZE/BANDAGES/DRESSINGS) ×2 IMPLANT
GLOVE BIOGEL PI IND STRL 7.5 (GLOVE) ×1 IMPLANT
GLOVE BIOGEL PI INDICATOR 7.5 (GLOVE) ×1
GLOVE ECLIPSE 7.5 STRL STRAW (GLOVE) ×2 IMPLANT
GOWN STRL REUS W/TWL XL LVL3 (GOWN DISPOSABLE) ×4 IMPLANT
KIT BASIN OR (CUSTOM PROCEDURE TRAY) ×2 IMPLANT
KIT PORT POWER 8FR ISP CVUE (Catheter) IMPLANT
KIT POWER CATH 8FR (Port) IMPLANT
NEEDLE HYPO 22GX1.5 SAFETY (NEEDLE) ×2 IMPLANT
PACK BASIC VI WITH GOWN DISP (CUSTOM PROCEDURE TRAY) ×2 IMPLANT
STRIP CLOSURE SKIN 1/2X4 (GAUZE/BANDAGES/DRESSINGS) IMPLANT
SUT MNCRL AB 4-0 PS2 18 (SUTURE) ×2 IMPLANT
SUT PROLENE 2 0 CT2 30 (SUTURE) ×2 IMPLANT
SYR 10ML ECCENTRIC (SYRINGE) ×2 IMPLANT
SYR CONTROL 10ML LL (SYRINGE) ×2 IMPLANT
TOWEL OR 17X26 10 PK STRL BLUE (TOWEL DISPOSABLE) ×2 IMPLANT
TOWEL OR NON WOVEN STRL DISP B (DISPOSABLE) ×2 IMPLANT

## 2016-10-09 NOTE — Op Note (Signed)
Preoperative Diagnosis: BREAST CANCER  Postoprative Diagnosis: BREAST CANCER  Procedure: Procedure(s): ATTEMPTED INSERTION PORT-A-CATH WITH [place]   Surgeon: Excell Seltzer T   Assistants: None  Anesthesia:  General LMA anesthesia  Indications: Patient is a 61 year old female with a recent diagnosis of cancer of the right breast she requires long-term IV access for chemotherapy. We have discussed Port-A-Cath placement previously including the indications and risks detailed elsewhere and she is brought to the operating room for this procedure.    Procedure Detail:  Patient brought to the operating room, placed in the supine position on the operating table, and laryngeal mask general anesthesia induced. She received preoperative IV antibiotics. She was carefully positioned with arms tucked and rolled behind the shoulder blades and the entire anterior chest shoulders and neck were widely sterilely prepped and draped. Patient timeout was performed and correct procedure verified. The left internal jugular vein was easily visualized with the ultrasound and was cannulated with a needle under direct vision. The guidewire was passed via the needle without difficulty. Portable chest x-ray however showed the guidewire heading out towards the right arm. Under fluoroscopic ends with multiple attempts the guidewire would not go inferiorly into the SVC and went out laterally toward the right arm on multiple attempts to thread the guidewire. I then elected to access the right internal jugular vein. I felt that using the left subclavian vein would encounter the same difficulty and was having from the left IJ. The right internal jugular vein again was easily seen and cannulated under direct vision using ultrasound guidance. The guidewire threaded a short distance but then met some slight resistance on several attempts even though I was clearly within the vein with excellent blood return and needle tip in the vein on  ultrasound. This seemed to thread about the same distance as were the guidewire previously deviated out to the right arm and after several attempts without success and not applying any undue pressure on the guidewire I elected to abort the procedure feeling that possibly venogram with interventional radiology would be helpful in determining the anatomy. The small skin incision were closed with a subcuticular Monocryl and Dermabond. As discussed with the patient then removed her right axillary JP drain which was dressed with sterile gauze and Neosporin. Sponge and needles counts were correct.    Findings: As above  Estimated Blood Loss:  Minimal         Drains: None  Blood Given: none          Specimens: None        Complications:  * No complications entered in OR log *         Disposition: PACU - hemodynamically stable.         Condition: stable

## 2016-10-09 NOTE — Anesthesia Procedure Notes (Signed)
Procedure Name: LMA Insertion Date/Time: 10/09/2016 7:42 AM Performed by: Carleene Cooper A Pre-anesthesia Checklist: Patient identified, Emergency Drugs available, Suction available, Patient being monitored and Timeout performed Patient Re-evaluated:Patient Re-evaluated prior to inductionOxygen Delivery Method: Circle system utilized Preoxygenation: Pre-oxygenation with 100% oxygen Intubation Type: IV induction LMA: LMA with gastric port inserted LMA Size: 4.0 Number of attempts: 1 Placement Confirmation: positive ETCO2 and breath sounds checked- equal and bilateral Tube secured with: Tape Dental Injury: Teeth and Oropharynx as per pre-operative assessment

## 2016-10-09 NOTE — Interval H&P Note (Signed)
History and Physical Interval Note:  10/09/2016 7:31 AM  Sandra Compton  has presented today for surgery, with the diagnosis of BREAST CANCER  The various methods of treatment have been discussed with the patient and family. After consideration of risks, benefits and other options for treatment, the patient has consented to  Procedure(s): INSERTION PORT-A-CATH WITH [place] (N/A) as a surgical intervention .  The patient's history has been reviewed, patient examined, no change in status, stable for surgery.  I have reviewed the patient's chart and labs.  Questions were answered to the patient's satisfaction.     Lelend Heinecke T

## 2016-10-09 NOTE — Discharge Instructions (Signed)
Implanted Port Home Guide °An implanted port is a type of central line that is placed under the skin. Central lines are used to provide IV access when treatment or nutrition needs to be given through a person's veins. Implanted ports are used for long-term IV access. An implanted port may be placed because:  °· You need IV medicine that would be irritating to the small veins in your hands or arms.   °· You need long-term IV medicines, such as antibiotics.   °· You need IV nutrition for a long period.   °· You need frequent blood draws for lab tests.   °· You need dialysis.   °Implanted ports are usually placed in the chest area, but they can also be placed in the upper arm, the abdomen, or the leg. An implanted port has two main parts:  °· Reservoir. The reservoir is round and will appear as a small, raised area under your skin. The reservoir is the part where a needle is inserted to give medicines or draw blood.   °· Catheter. The catheter is a thin, flexible tube that extends from the reservoir. The catheter is placed into a large vein. Medicine that is inserted into the reservoir goes into the catheter and then into the vein.   °HOW WILL I CARE FOR MY INCISION SITE? °Do not get the incision site wet. Bathe or shower as directed by your health care provider.  °HOW IS MY PORT ACCESSED? °Special steps must be taken to access the port:  °· Before the port is accessed, a numbing cream can be placed on the skin. This helps numb the skin over the port site.   °· Your health care provider uses a sterile technique to access the port. °· Your health care provider must put on a mask and sterile gloves. °· The skin over your port is cleaned carefully with an antiseptic and allowed to dry. °· The port is gently pinched between sterile gloves, and a needle is inserted into the port. °· Only "non-coring" port needles should be used to access the port. Once the port is accessed, a blood return should be checked. This helps  ensure that the port is in the vein and is not clogged.   °· If your port needs to remain accessed for a constant infusion, a clear (transparent) bandage will be placed over the needle site. The bandage and needle will need to be changed every week, or as directed by your health care provider.   °· Keep the bandage covering the needle clean and dry. Do not get it wet. Follow your health care provider's instructions on how to take a shower or bath while the port is accessed.   °· If your port does not need to stay accessed, no bandage is needed over the port.   °WHAT IS FLUSHING? °Flushing helps keep the port from getting clogged. Follow your health care provider's instructions on how and when to flush the port. Ports are usually flushed with saline solution or a medicine called heparin. The need for flushing will depend on how the port is used.  °· If the port is used for intermittent medicines or blood draws, the port will need to be flushed:   °· After medicines have been given.   °· After blood has been drawn.   °· As part of routine maintenance.   °· If a constant infusion is running, the port may not need to be flushed.   °HOW LONG WILL MY PORT STAY IMPLANTED? °The port can stay in for as long as your health care   provider thinks it is needed. When it is time for the port to come out, surgery will be done to remove it. The procedure is similar to the one performed when the port was put in.  °WHEN SHOULD I SEEK IMMEDIATE MEDICAL CARE? °When you have an implanted port, you should seek immediate medical care if:  °· You notice a bad smell coming from the incision site.   °· You have swelling, redness, or drainage at the incision site.   °· You have more swelling or pain at the port site or the surrounding area.   °· You have a fever that is not controlled with medicine. °This information is not intended to replace advice given to you by your health care provider. Make sure you discuss any questions you have with  your health care provider. °Document Released: 09/24/2005 Document Revised: 07/15/2013 Document Reviewed: 06/01/2013 °Elsevier Interactive Patient Education © 2017 [employer] °Implanted Port Insertion, Care After °Refer to this sheet in the next few weeks. These instructions provide you with information on caring for yourself after your procedure. Your health care provider may also give you more specific instructions. Your treatment has been planned according to current medical practices, but problems sometimes occur. Call your health care provider if you have any problems or questions after your procedure. °WHAT TO EXPECT AFTER THE PROCEDURE °After your procedure, it is typical to have the following:  °· Discomfort at the port insertion site. Ice packs to the area will help. °· Bruising on the skin over the port. This will subside in 3-4 days. °HOME CARE INSTRUCTIONS °· After your port is placed, you will get a manufacturer's information card. The card has information about your port. Keep this card with you at all times.   °· Know what kind of port you have. There are many types of ports available.   °· Wear a medical alert bracelet in case of an emergency. This can help alert health care workers that you have a port.   °· The port can stay in for as long as your health care provider believes it is necessary.   °· A home health care nurse may give medicines and take care of the port.   °· You or a family member can get special training and directions for giving medicine and taking care of the port at home.   °SEEK MEDICAL CARE IF:  °· Your port does not flush or you are unable to get a blood return.   °· You have a fever or chills. °SEEK IMMEDIATE MEDICAL CARE IF: °· You have new fluid or pus coming from your incision.   °· You notice a bad smell coming from your incision site.   °· You have swelling, pain, or more redness at the incision or port site.   °· You have chest pain or shortness of breath. °This  information is not intended to replace advice given to you by your health care provider. Make sure you discuss any questions you have with your health care provider. °Document Released: 07/15/2013 Document Revised: 09/29/2013 Document Reviewed: 07/15/2013 °Elsevier Interactive Patient Education © 2017 [employer] °Moderate Conscious Sedation, Adult, Care After °These instructions provide you with information about caring for yourself after your procedure. Your health care provider may also give you more specific instructions. Your treatment has been planned according to current medical practices, but problems sometimes occur. Call your health care provider if you have any problems or questions after your procedure. °What can I expect after the procedure? °After your procedure, it is common: °·   To feel sleepy for several hours.  To feel clumsy and have poor balance for several hours.  To have poor judgment for several hours.  To vomit if you eat too soon. Follow these instructions at home: For at least 24 hours after the procedure:   Do not:  Participate in activities where you could fall or become injured.  Drive.  Use heavy machinery.  Drink alcohol.  Take sleeping pills or medicines that cause drowsiness.  Make important decisions or sign legal documents.  Take care of children on your own.  Rest. Eating and drinking  Follow the diet recommended by your health care provider.  If you vomit:  Drink water, juice, or soup when you can drink without vomiting.  Make sure you have little or no nausea before eating solid foods. General instructions  Have a responsible adult stay with you until you are awake and alert.  Take over-the-counter and prescription medicines only as told by your health care provider.  If you smoke, do not smoke without supervision.  Keep all follow-up visits as told by your health care provider. This is important. Contact a health care provider  if:  You keep feeling nauseous or you keep vomiting.  You feel light-headed.  You develop a rash.  You have a fever. Get help right away if:  You have trouble breathing. This information is not intended to replace advice given to you by your health care provider. Make sure you discuss any questions you have with your health care provider. Document Released: 07/15/2013 Document Revised: 02/27/2016 Document Reviewed: 01/14/2016 Elsevier Interactive Patient Education  2017 [place] - right side, remove after 1:00 tomorrow, shower, no dressing needed.  Call Dr. Excell Seltzer with any problems.

## 2016-10-09 NOTE — H&P (Signed)
Chief Complaint: breast cancer  Referring Physician:Dr. Excell Seltzer  Supervising Physician: Sandi Mariscal  Patient Status: [facility] - Out-pt  HPI: Sandra Compton is an 61 y.o. female who was recently diagnosed with breast cancer of the right breast.  She underwent a lumpectomy and sentinel lymph node dissection.  She presented today for a PAC to be placed by Dr. Excell Seltzer; however, he was unable to place this on either side. She has been sent to short stay so that IR can attempt to place this so she has access for chemotherapy and for other needs.  She is tearful and frustrated with her situation.  She otherwise has no other complaints.  Past Medical History:  Past Medical History:  Diagnosis Date  . Arthritis   . Breast cancer ([place])    a. 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  . Depression   . Diabetes mellitus without complication ([place])    a. Dx ~ 2004.  . Drainage from wound    Terrial Rhodes to R. axilla with bulb closure.  . Fibromyalgia   . Hypertension    a. > 20 yr hx.  Marland Kitchen LBBB (left bundle branch block)    a. Dx 08/2016.  . Seasonal allergies   . Snores   . Systolic murmur   . Transfusion history    '05 s/p Hysterectomy    Past Surgical History:  Past Surgical History:  Procedure Laterality Date  . ABDOMINAL HYSTERECTOMY    . DILATION AND CURETTAGE OF UTERUS    . MOUTH SURGERY    . RADIOACTIVE SEED GUIDED PARTIAL MASTECTOMY/AXILLARY SENTINEL NODE BIOPSY/AXILLARY NODE DISSECTION Right 09/05/2016   Procedure: RIGHT BREAST SEED GUIDED LUMPECTOMY, RIGHT AXILLARY SEED TARGETED DISSECTION AND RIGHT AXILLARY DISSECTION;  Surgeon: Excell Seltzer, MD;  Location: [place];  Service: General;  Laterality: Right;  . TONSILLECTOMY      Family History:  Family History  Problem Relation Age of Onset  . Cancer Paternal Aunt     breast  . Cancer Other     breast  .  Heart failure Mother     died suddenly @ 64.  . Stroke Father     died @ 82. ? h/o silent MI.  Marland Kitchen Hypertension Father   . Diabetes Brother   . Hypertension Brother   . Brain cancer Brother   . Hypertension Brother     Social History:  reports that she has never smoked. She has never used smokeless tobacco. She reports that she does not drink alcohol or use drugs.  Allergies:  Allergies  Allergen Reactions  . Codeine Other (See Comments)    Dizzy  . Penicillins Rash    Has patient had a PCN reaction causing immediate rash, facial/tongue/throat swelling, SOB or lightheadedness with hypotension:No Has patient had a PCN reaction causing severe rash involving mucus membranes or skin necrosis: No Has patient had a PCN reaction that required hospitalization No Has patient had a PCN reaction occurring within the last 10 years: No If all of the above answers are "NO", then may proceed with Cephalosporin use.     Medications: Medications reviewed in epic  Please HPI for pertinent positives, otherwise complete 10 system ROS negative.  Mallampati Score: MD Evaluation Airway: WNL Heart: WNL Abdomen: WNL Chest/ Lungs: WNL ASA  Classification: 3 Mallampati/Airway Score: Two  Physical Exam: Temp (F)     98.3 98.1-98.7  98.7 (  37.1)  01/02 0956  Pulse Rate     87 84-96  94  01/02 0956  Resp     16 11-15  14  01/02 0956  BP     162/73 152/71-167/85  164/68  01/02 0956  SpO2 (%)     99 91-100  91  01/02 0956  Weight (lb)  189   189 lb (85.7 kg)  01/02    General: pleasant, obese, white female who is laying in bed in NAD HEENT: head is normocephalic, atraumatic.  Sclera are noninjected.  PERRL.  Ears and nose without any masses or lesions.  Mouth is pink and moist.  2 puncture sites, one on each side of her neck with dermabond present Heart: regular, rate, and rhythm.  Normal s1,s2. No obvious murmurs, gallops, or rubs noted.  Palpable radial and pedal pulses bilaterally Lungs:  CTAB, no wheezes, rhonchi, or rales noted.  Respiratory effort nonlabored Abd: soft, NT, ND, +BS, no masses, hernias, or organomegaly Psych: A&Ox3 with an appropriate affect.   Labs: Results for orders placed or performed during the hospital encounter of 10/09/16 (from the past 48 hour(s))  Glucose, capillary     Status: Abnormal   Collection Time: 10/09/16  6:06 AM  Result Value Ref Range   Glucose-Capillary 223 (H) 65 - 99 mg/dL   Comment 1 Notify RN     Imaging: Dg Chest Port 1 View  Result Date: 10/09/2016 CLINICAL DATA:  Unsuccessful attempts at Port-A-Cath placement. Right breast cancer EXAM: PORTABLE CHEST 1 VIEW COMPARISON:  None. FINDINGS: Heart is borderline in size. Lungs are clear. No effusions or pneumothorax. No acute bony abnormality. Surgical clips in the right axilla. IMPRESSION: No pneumothorax. Electronically Signed   By: Rolm Baptise M.D.   On: 10/09/2016 09:27   Dg C-arm 1-60 Min-no Report  Result Date: 10/09/2016 There is no Radiologist interpretation  for this exam.   Assessment/Plan 1. Right-sided breast cancer -we will plan to attempt placement of a port a cath for this patient.  She will be given vancomycin on call for this procedure. -her labs have been reviewed -Risks and Benefits discussed with the patient including, but not limited to bleeding, infection, pneumothorax, or fibrin sheath development and need for additional procedures. All of the patient's questions were answered, patient is agreeable to proceed. Consent signed and in chart.   Thank you for this interesting consult.  I greatly enjoyed meeting Zakeria Pettinato and look forward to participating in their care.  A copy of this report was sent to the requesting provider on this date.  Electronically Signed: Henreitta Cea 10/09/2016, 10:51 AM   I spent a total of  30 Minutes   in face to face in clinical consultation, greater than 50% of which was counseling/coordinating care for breast  cancer

## 2016-10-09 NOTE — Procedures (Signed)
Successful placement of left IJ approach port-a-cath with tip at the superior caval atrial junction. The catheter is ready for immediate use. Estimated Blood Loss: Minimal No immediate post procedural complications.  Jay Promiss Labarbera, MD Pager #: [phone]   

## 2016-10-09 NOTE — Transfer of Care (Signed)
Immediate Anesthesia Transfer of Care Note  Patient: Sandra Compton  Procedure(s) Performed: Procedure(s): ATTEMPTED INSERTION PORT-A-CATH WITH [place] (N/A)  Patient Location: PACU  Anesthesia Type:General  Level of Consciousness: awake, alert , oriented and patient cooperative  Airway & Oxygen Therapy: Patient Spontanous Breathing and Patient connected to face mask oxygen  Post-op Assessment: Report given to RN, Post -op Vital signs reviewed and stable and Patient moving all extremities  Post vital signs: Reviewed and stable  Last Vitals:  Vitals:   10/09/16 0612  BP: (!) 162/73  Pulse: 87  Resp: 16  Temp: 36.8 C    Last Pain:  Vitals:   10/09/16 0612  TempSrc: Oral      Patients Stated Pain Goal: 3 ([id number])  Complications: No apparent anesthesia complications

## 2016-10-09 NOTE — Discharge Instructions (Signed)
Implanted Port Home Guide °An implanted port is a type of central line that is placed under the skin. Central lines are used to provide IV access when treatment or nutrition needs to be given through a person's veins. Implanted ports are used for long-term IV access. An implanted port may be placed because:  °· You need IV medicine that would be irritating to the small veins in your hands or arms.   °· You need long-term IV medicines, such as antibiotics.   °· You need IV nutrition for a long period.   °· You need frequent blood draws for lab tests.   °· You need dialysis.   °Implanted ports are usually placed in the chest area, but they can also be placed in the upper arm, the abdomen, or the leg. An implanted port has two main parts:  °· Reservoir. The reservoir is round and will appear as a small, raised area under your skin. The reservoir is the part where a needle is inserted to give medicines or draw blood.   °· Catheter. The catheter is a thin, flexible tube that extends from the reservoir. The catheter is placed into a large vein. Medicine that is inserted into the reservoir goes into the catheter and then into the vein.   °HOW WILL I CARE FOR MY INCISION SITE? °Do not get the incision site wet. Bathe or shower as directed by your health care provider.  °HOW IS MY PORT ACCESSED? °Special steps must be taken to access the port:  °· Before the port is accessed, a numbing cream can be placed on the skin. This helps numb the skin over the port site.   °· Your health care provider uses a sterile technique to access the port. °¨ Your health care provider must put on a mask and sterile gloves. °¨ The skin over your port is cleaned carefully with an antiseptic and allowed to dry. °¨ The port is gently pinched between sterile gloves, and a needle is inserted into the port. °· Only "non-coring" port needles should be used to access the port. Once the port is accessed, a blood return should be checked. This helps  ensure that the port is in the vein and is not clogged.   °· If your port needs to remain accessed for a constant infusion, a clear (transparent) bandage will be placed over the needle site. The bandage and needle will need to be changed every week, or as directed by your health care provider.   °· Keep the bandage covering the needle clean and dry. Do not get it wet. Follow your health care provider's instructions on how to take a shower or bath while the port is accessed.   °· If your port does not need to stay accessed, no bandage is needed over the port.   °WHAT IS FLUSHING? °Flushing helps keep the port from getting clogged. Follow your health care provider's instructions on how and when to flush the port. Ports are usually flushed with saline solution or a medicine called heparin. The need for flushing will depend on how the port is used.  °· If the port is used for intermittent medicines or blood draws, the port will need to be flushed:   °¨ After medicines have been given.   °¨ After blood has been drawn.   °¨ As part of routine maintenance.   °· If a constant infusion is running, the port may not need to be flushed.   °HOW LONG WILL MY PORT STAY IMPLANTED? °The port can stay in for as long as your health care   provider thinks it is needed. When it is time for the port to come out, surgery will be done to remove it. The procedure is similar to the one performed when the port was put in.  °WHEN SHOULD I SEEK IMMEDIATE MEDICAL CARE? °When you have an implanted port, you should seek immediate medical care if:  °· You notice a bad smell coming from the incision site.   °· You have swelling, redness, or drainage at the incision site.   °· You have more swelling or pain at the port site or the surrounding area.   °· You have a fever that is not controlled with medicine. °This information is not intended to replace advice given to you by your health care provider. Make sure you discuss any questions you have with  your health care provider. °Document Released: 09/24/2005 Document Revised: 07/15/2013 Document Reviewed: 06/01/2013 °Elsevier Interactive Patient Education © 2017 [employer] °Implanted Port Insertion, Care After °Refer to this sheet in the next few weeks. These instructions provide you with information on caring for yourself after your procedure. Your health care provider may also give you more specific instructions. Your treatment has been planned according to current medical practices, but problems sometimes occur. Call your health care provider if you have any problems or questions after your procedure. °WHAT TO EXPECT AFTER THE PROCEDURE °After your procedure, it is typical to have the following:  °· Discomfort at the port insertion site. Ice packs to the area will help. °· Bruising on the skin over the port. This will subside in 3-4 days. °HOME CARE INSTRUCTIONS °· After your port is placed, you will get a manufacturer's information card. The card has information about your port. Keep this card with you at all times.   °· Know what kind of port you have. There are many types of ports available.   °· Wear a medical alert bracelet in case of an emergency. This can help alert health care workers that you have a port.   °· The port can stay in for as long as your health care provider believes it is necessary.   °· A home health care nurse may give medicines and take care of the port.   °· You or a family member can get special training and directions for giving medicine and taking care of the port at home.   °SEEK MEDICAL CARE IF:  °· Your port does not flush or you are unable to get a blood return.   °· You have a fever or chills. °SEEK IMMEDIATE MEDICAL CARE IF: °· You have new fluid or pus coming from your incision.   °· You notice a bad smell coming from your incision site.   °· You have swelling, pain, or more redness at the incision or port site.   °· You have chest pain or shortness of breath. °This  information is not intended to replace advice given to you by your health care provider. Make sure you discuss any questions you have with your health care provider. °Document Released: 07/15/2013 Document Revised: 09/29/2013 Document Reviewed: 07/15/2013 °Elsevier Interactive Patient Education © 2017 [employer] °Moderate Conscious Sedation, Adult °Sedation is the use of medicines to promote relaxation and relieve discomfort and anxiety. Moderate conscious sedation is a type of sedation. Under moderate conscious sedation, you are less alert than normal, but you are still able to respond to instructions, touch, or both. °Moderate conscious sedation is used during short medical and dental procedures. It is milder than deep sedation, which is a type of sedation under which   you cannot be easily woken up. It is also milder than general anesthesia, which is the use of medicines to make you unconscious. Moderate conscious sedation allows you to return to your regular activities sooner. °Tell a health care provider about: °· Any allergies you have. °· All medicines you are taking, including vitamins, herbs, eye drops, creams, and over-the-counter medicines. °· Use of steroids (by mouth or creams). °· Any problems you or family members have had with sedatives and anesthetic medicines. °· Any blood disorders you have. °· Any surgeries you have had. °· Any medical conditions you have, such as sleep apnea. °· Whether you are pregnant or may be pregnant. °· Any use of cigarettes, alcohol, marijuana, or street drugs. °What are the risks? °Generally, this is a safe procedure. However, problems may occur, including: °· Getting too much medicine (oversedation). °· Nausea. °· Allergic reaction to medicines. °· Trouble breathing. If this happens, a breathing tube may be used to help with breathing. It will be removed when you are awake and breathing on your own. °· Heart trouble. °· Lung trouble. °What happens before the  procedure? °Staying hydrated  °Follow instructions from your health care provider about hydration, which may include: °· Up to 2 hours before the procedure - you may continue to drink clear liquids, such as water, clear fruit juice, black coffee, and plain tea. °Eating and drinking restrictions  °Follow instructions from your health care provider about eating and drinking, which may include: °· 8 hours before the procedure - stop eating heavy meals or foods such as meat, fried foods, or fatty foods. °· 6 hours before the procedure - stop eating light meals or foods, such as toast or cereal. °· 6 hours before the procedure - stop drinking milk or drinks that contain milk. °· 2 hours before the procedure - stop drinking clear liquids. °Medicine  °Ask your health care provider about: °· Changing or stopping your regular medicines. This is especially important if you are taking diabetes medicines or blood thinners. °· Taking medicines such as aspirin and ibuprofen. These medicines can thin your blood. Do not take these medicines before your procedure if your health care provider instructs you not to. °Tests and exams °· You will have a physical exam. °· You may have blood tests done to show: °¨ How well your kidneys and liver are working. °¨ How well your blood can clot. °General instructions °· Plan to have someone take you home from the hospital or clinic. °· If you will be going home right after the procedure, plan to have someone with you for 24 hours. °What happens during the procedure? °· An IV tube will be inserted into one of your veins. °· Medicine to help you relax (sedative) will be given through the IV tube. °· The medical or dental procedure will be performed. °What happens after the procedure? °· Your blood pressure, heart rate, breathing rate, and blood oxygen level will be monitored often until the medicines you were given have worn off. °· Do not drive for 24 hours. °This information is not intended to  replace advice given to you by your health care provider. Make sure you discuss any questions you have with your health care provider. °Document Released: 06/19/2001 Document Revised: 02/28/2016 Document Reviewed: 01/14/2016 °Elsevier Interactive Patient Education © 2017 [employer] ° °

## 2016-10-09 NOTE — Anesthesia Postprocedure Evaluation (Signed)
Anesthesia Post Note  Patient: Sandra Compton  Procedure(s) Performed: Procedure(s) (LRB): ATTEMPTED INSERTION PORT-A-CATH WITH [place] (N/A)  Patient location during evaluation: PACU Anesthesia Type: General Level of consciousness: awake and alert Pain management: pain level controlled Vital Signs Assessment: post-procedure vital signs reviewed and stable Respiratory status: spontaneous breathing, nonlabored ventilation, respiratory function stable and patient connected to nasal cannula oxygen Cardiovascular status: blood pressure returned to baseline and stable Postop Assessment: no signs of nausea or vomiting Anesthetic complications: no       Last Vitals:  Vitals:   10/09/16 0945 10/09/16 0956  BP: (!) 152/72 (!) 164/68  Pulse: 92 94  Resp: 13 14  Temp: 37.1 C 37.1 C    Last Pain:  Vitals:   10/09/16 0945  TempSrc:   PainSc: 0-No pain                 Tiajuana Amass

## 2016-10-09 NOTE — Anesthesia Preprocedure Evaluation (Signed)
Anesthesia Evaluation  Patient identified by MRN, date of birth, ID band Patient awake    Reviewed: Allergy & Precautions, NPO status , Patient's Chart, lab work & pertinent test results, reviewed documented beta blocker date and time   Airway Mallampati: III       Dental no notable dental hx. (+) Teeth Intact, Caps   Pulmonary neg pulmonary ROS,  Snores-? Undiagnosed OSA   Pulmonary exam normal breath sounds clear to auscultation       Cardiovascular hypertension, Pt. on home beta blockers Normal cardiovascular exam+ dysrhythmias + Valvular Problems/Murmurs  Rhythm:Regular Rate:Normal  LBBB- recently diagnosed Cardiac w/u negative   Neuro/Psych PSYCHIATRIC DISORDERS Depression negative neurological ROS     GI/Hepatic   Endo/Other  diabetes, Well Controlled, Type 2, Oral Hypoglycemic AgentsMorbid obesityRight breast Ca  Renal/GU   negative genitourinary   Musculoskeletal  (+) Arthritis , Fibromyalgia -  Abdominal (+) + obese,   Peds  Hematology   Anesthesia Other Findings   Reproductive/Obstetrics                            EKG: normal sinus rhythm, LBBB, left axis deviation. Lab Results  Component Value Date   WBC 12.2 (H) 10/02/2016   HGB 14.6 10/02/2016   HCT 44.0 10/02/2016   MCV 84.5 10/02/2016   PLT 273 10/02/2016   Lab Results  Component Value Date   CREATININE 0.72 10/02/2016   BUN 12 10/02/2016   NA 138 10/02/2016   K 4.8 10/02/2016   CL 100 (L) 10/02/2016   CO2 30 10/02/2016    Anesthesia Physical  Anesthesia Plan  ASA: III  Anesthesia Plan: General   Post-op Pain Management:    Induction: Intravenous  Airway Management Planned: LMA  Additional Equipment:   Intra-op Plan:   Post-operative Plan: Extubation in OR  Informed Consent: I have reviewed the patients History and Physical, chart, labs and discussed the procedure including the risks, benefits and  alternatives for the proposed anesthesia with the patient or authorized representative who has indicated his/her understanding and acceptance.   Dental advisory given  Plan Discussed with: CRNA, Anesthesiologist and Surgeon  Anesthesia Plan Comments:         Anesthesia Quick Evaluation

## 2016-10-09 NOTE — Anesthesia Preprocedure Evaluation (Addendum)
Anesthesia Evaluation  Patient identified by MRN, date of birth, ID band Patient awake    Reviewed: Allergy & Precautions, NPO status , Patient's Chart, lab work & pertinent test results, reviewed documented beta blocker date and time   Airway Mallampati: II  TM Distance: >3 FB Neck ROM: Full    Dental  (+) Dental Advisory Given   Pulmonary neg pulmonary ROS,    breath sounds clear to auscultation       Cardiovascular hypertension, Pt. on medications and Pt. on home beta blockers  Rhythm:Regular Rate:Normal     Neuro/Psych Depression negative neurological ROS     GI/Hepatic negative GI ROS, Neg liver ROS,   Endo/Other  diabetes, Type 2, Oral Hypoglycemic Agents  Renal/GU negative Renal ROS     Musculoskeletal  (+) Arthritis ,   Abdominal   Peds  Hematology negative hematology ROS (+)   Anesthesia Other Findings   Reproductive/Obstetrics                            Lab Results  Component Value Date   WBC 12.2 (H) 10/02/2016   HGB 14.6 10/02/2016   HCT 44.0 10/02/2016   MCV 84.5 10/02/2016   PLT 273 10/02/2016   Lab Results  Component Value Date   CREATININE 0.72 10/02/2016   BUN 12 10/02/2016   NA 138 10/02/2016   K 4.8 10/02/2016   CL 100 (L) 10/02/2016   CO2 30 10/02/2016    Anesthesia Physical Anesthesia Plan  ASA: II  Anesthesia Plan: General   Post-op Pain Management:    Induction: Intravenous  Airway Management Planned: LMA  Additional Equipment:   Intra-op Plan:   Post-operative Plan: Extubation in OR  Informed Consent: I have reviewed the patients History and Physical, chart, labs and discussed the procedure including the risks, benefits and alternatives for the proposed anesthesia with the patient or authorized representative who has indicated his/her understanding and acceptance.   Dental advisory given  Plan Discussed with: CRNA  Anesthesia Plan  Comments:         Anesthesia Quick Evaluation

## 2016-10-10 ENCOUNTER — Ambulatory Visit: Payer: BC Managed Care – PPO | Admitting: Hematology and Oncology

## 2016-10-10 ENCOUNTER — Encounter (HOSPITAL_COMMUNITY): Payer: Self-pay | Admitting: General Surgery

## 2016-10-10 DIAGNOSIS — Z170 Estrogen receptor positive status [ER+]: Secondary | ICD-10-CM | POA: Diagnosis not present

## 2016-10-10 DIAGNOSIS — C50211 Malignant neoplasm of upper-inner quadrant of right female breast: Secondary | ICD-10-CM | POA: Diagnosis not present

## 2016-10-10 DIAGNOSIS — N61 Mastitis without abscess: Secondary | ICD-10-CM | POA: Diagnosis not present

## 2016-10-10 DIAGNOSIS — E119 Type 2 diabetes mellitus without complications: Secondary | ICD-10-CM | POA: Diagnosis not present

## 2016-10-11 ENCOUNTER — Telehealth: Payer: Self-pay | Admitting: *Deleted

## 2016-10-11 NOTE — Telephone Encounter (Signed)
Pt was seen by Dr. Hinton Rao on 10/10/16. Pt will continue care in [place] for medical and radiation oncology. Physician team aware.

## 2016-10-18 ENCOUNTER — Telehealth: Payer: Self-pay | Admitting: *Deleted

## 2016-10-18 NOTE — Telephone Encounter (Signed)
THIS PATIENT HAS STARTED CHEMO IN [place] TODAY AND WILL BE SEEING DR. PALERMO AT SOME POINT AFTER THIS, PER BARBARA (SCHEDULER FOR DR. Bobby Rumpf AND DR. PALERMO)

## 2016-10-29 NOTE — Addendum Note (Signed)
Addended by: Waylan Rocher on: 10/29/2016 07:50 AM   Modules accepted: Orders

## 2016-11-27 DIAGNOSIS — C50911 Malignant neoplasm of unspecified site of right female breast: Secondary | ICD-10-CM | POA: Diagnosis not present

## 2016-11-27 DIAGNOSIS — Z170 Estrogen receptor positive status [ER+]: Secondary | ICD-10-CM | POA: Diagnosis not present

## 2016-12-10 DIAGNOSIS — L03313 Cellulitis of chest wall: Secondary | ICD-10-CM | POA: Diagnosis not present

## 2016-12-10 DIAGNOSIS — Z170 Estrogen receptor positive status [ER+]: Secondary | ICD-10-CM | POA: Diagnosis not present

## 2016-12-10 DIAGNOSIS — E119 Type 2 diabetes mellitus without complications: Secondary | ICD-10-CM | POA: Diagnosis not present

## 2016-12-10 DIAGNOSIS — C50911 Malignant neoplasm of unspecified site of right female breast: Secondary | ICD-10-CM | POA: Diagnosis not present

## 2017-02-04 DIAGNOSIS — I890 Lymphedema, not elsewhere classified: Secondary | ICD-10-CM | POA: Diagnosis not present

## 2017-02-04 DIAGNOSIS — C50911 Malignant neoplasm of unspecified site of right female breast: Secondary | ICD-10-CM | POA: Diagnosis not present

## 2017-02-25 DIAGNOSIS — C50911 Malignant neoplasm of unspecified site of right female breast: Secondary | ICD-10-CM | POA: Diagnosis not present

## 2017-02-25 DIAGNOSIS — J019 Acute sinusitis, unspecified: Secondary | ICD-10-CM | POA: Diagnosis not present

## 2017-02-25 DIAGNOSIS — D649 Anemia, unspecified: Secondary | ICD-10-CM

## 2017-02-25 DIAGNOSIS — I890 Lymphedema, not elsewhere classified: Secondary | ICD-10-CM | POA: Diagnosis not present

## 2017-06-06 DIAGNOSIS — H109 Unspecified conjunctivitis: Secondary | ICD-10-CM | POA: Diagnosis not present

## 2017-06-06 DIAGNOSIS — Z923 Personal history of irradiation: Secondary | ICD-10-CM | POA: Diagnosis not present

## 2017-06-06 DIAGNOSIS — Z79811 Long term (current) use of aromatase inhibitors: Secondary | ICD-10-CM | POA: Diagnosis not present

## 2017-06-06 DIAGNOSIS — I890 Lymphedema, not elsewhere classified: Secondary | ICD-10-CM | POA: Diagnosis not present

## 2017-06-06 DIAGNOSIS — R739 Hyperglycemia, unspecified: Secondary | ICD-10-CM | POA: Diagnosis not present

## 2017-06-06 DIAGNOSIS — C50911 Malignant neoplasm of unspecified site of right female breast: Secondary | ICD-10-CM | POA: Diagnosis not present

## 2017-06-06 DIAGNOSIS — Z9221 Personal history of antineoplastic chemotherapy: Secondary | ICD-10-CM | POA: Diagnosis not present

## 2017-06-13 IMAGING — CT CT CHEST W/ CM
2 of 5 series · 13 of 36 positions shown, 16 images · IV contrast (iopamidol)
Comparison: Abdomen and pelvis CT from 02/16/2013.

CLINICAL DATA: Right breast cancer.

EXAM:
CT CHEST, ABDOMEN, AND PELVIS WITH CONTRAST
TECHNIQUE: Multidetector CT imaging of the chest, abdomen and pelvis was
performed following the standard protocol during bolus
administration of intravenous contrast.
CONTRAST:  1 YXKIRK-133 IOPAMIDOL (YXKIRK-133) INJECTION 61%

[Series 2: cap with · axial · 0.86mm/px · z∈[-234,+272]mm · 10 of 125 slices shown, 13 images]
[im 12/125  mediastinal]
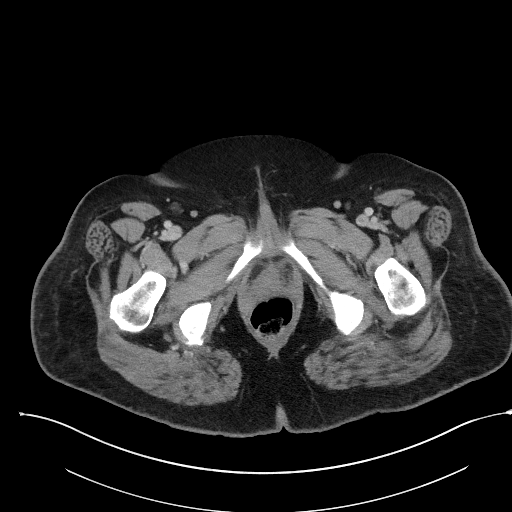
[im 12/125  lung]
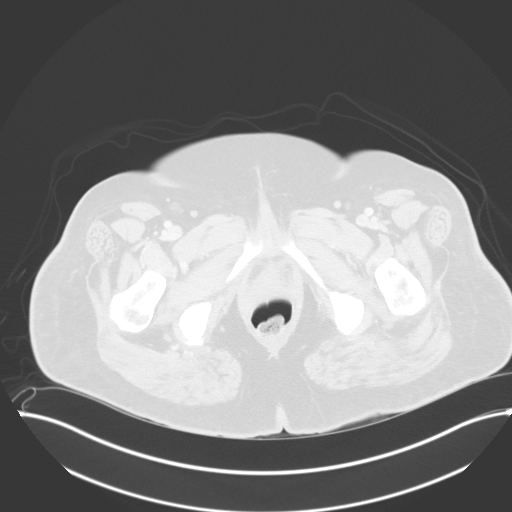
[im 23/125  lung]
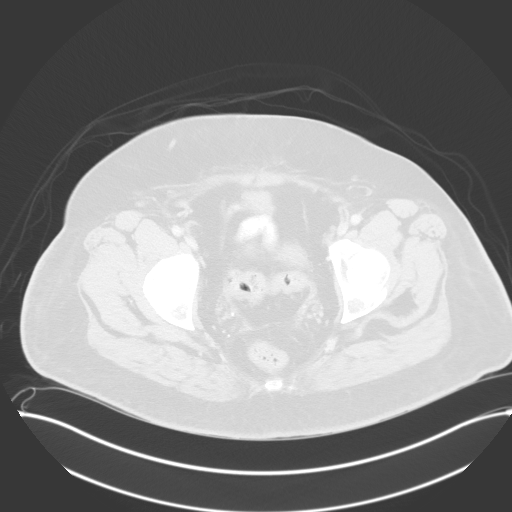
[im 34/125  lung]
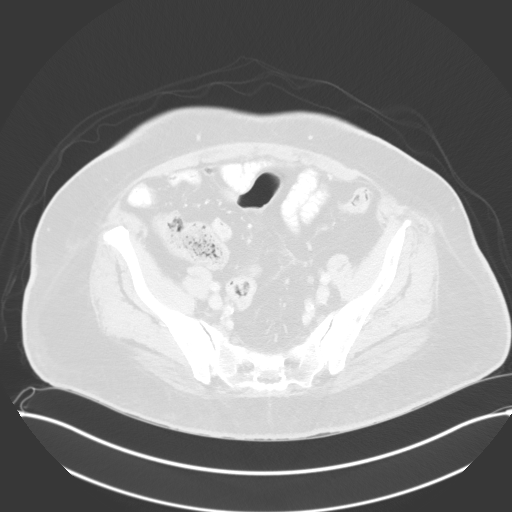
[im 46/125  lung]
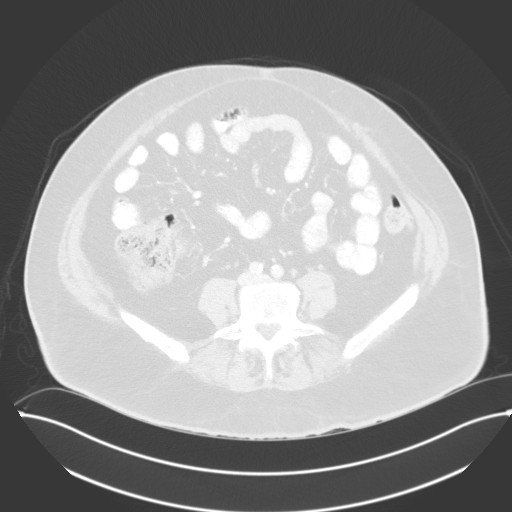
[im 57/125  mediastinal]
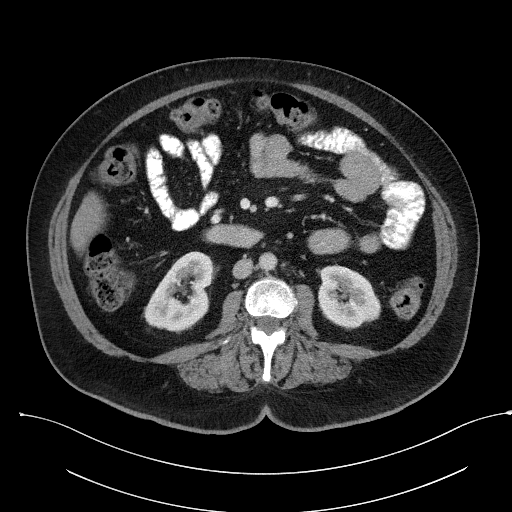
[im 57/125  lung]
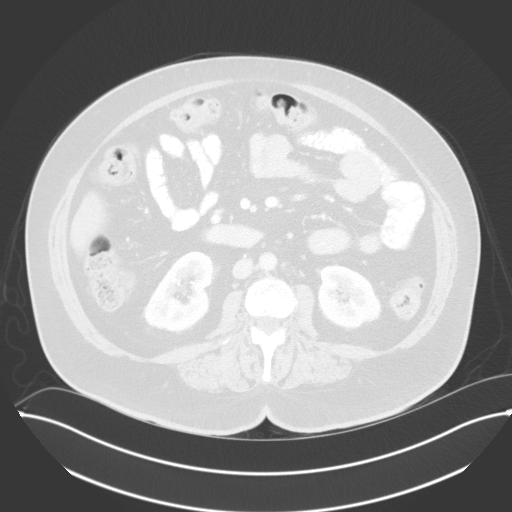
[im 68/125  lung]
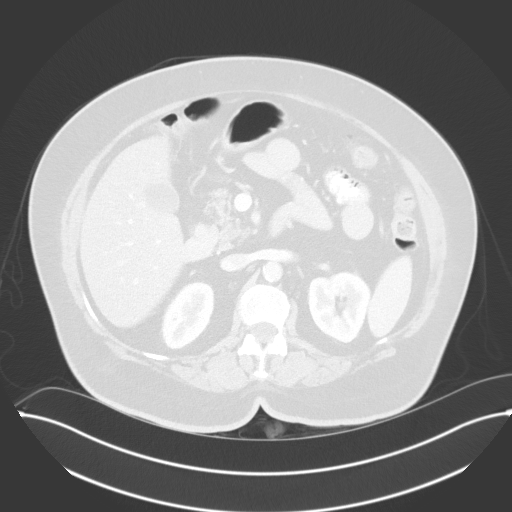
[im 79/125  lung]
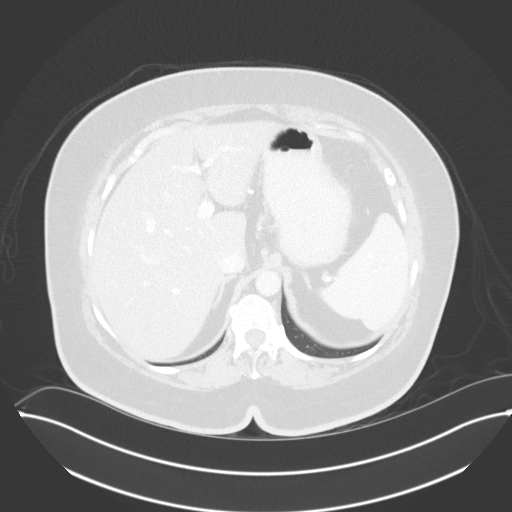
[im 91/125  lung]
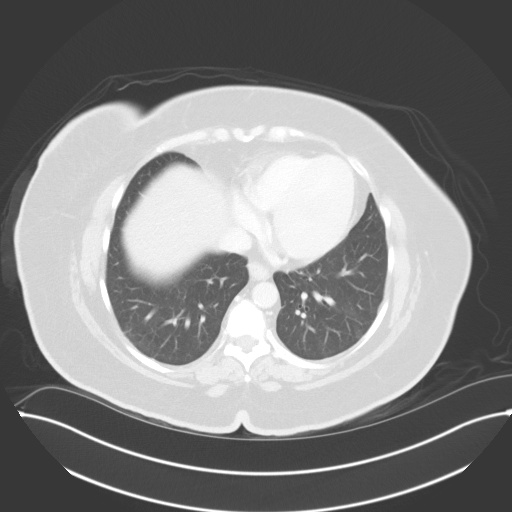
[im 102/125  mediastinal]
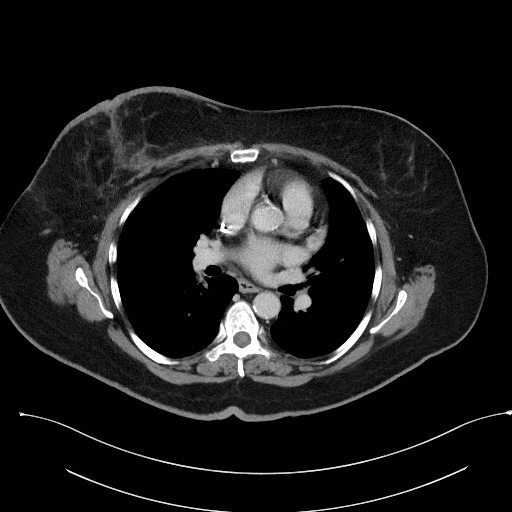
[im 102/125  lung]
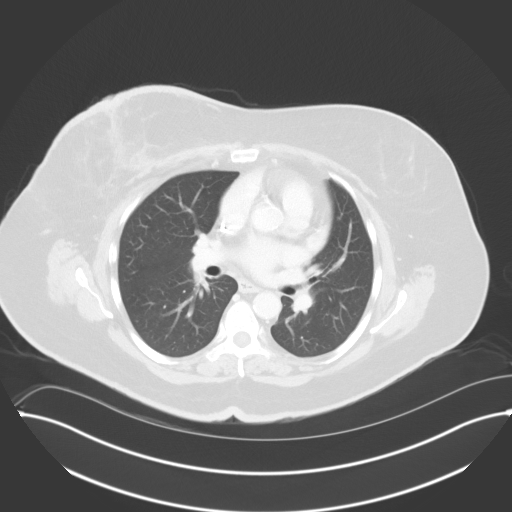
[im 113/125  lung]
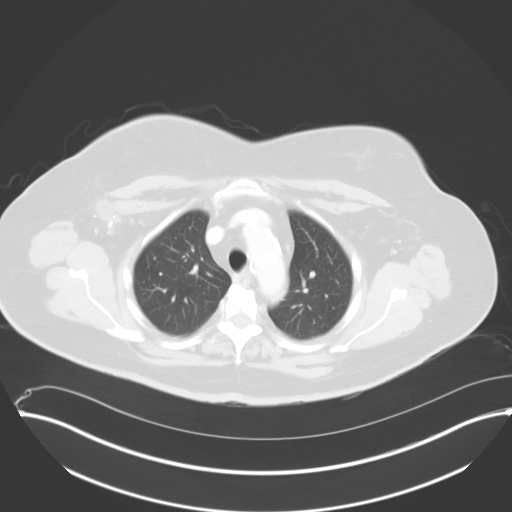

[Series 4: coronals · coronal · 0.82mm/px · 3 of 143 slices shown]
[im 29/143  lung]
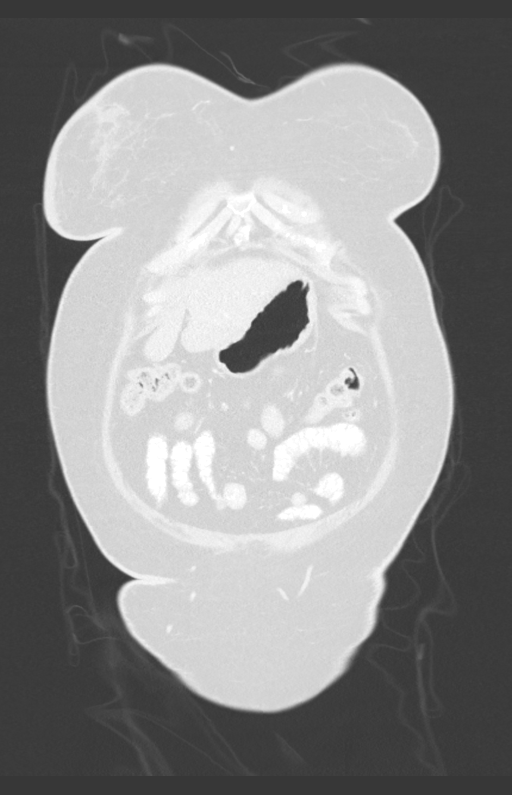
[im 57/143  lung]
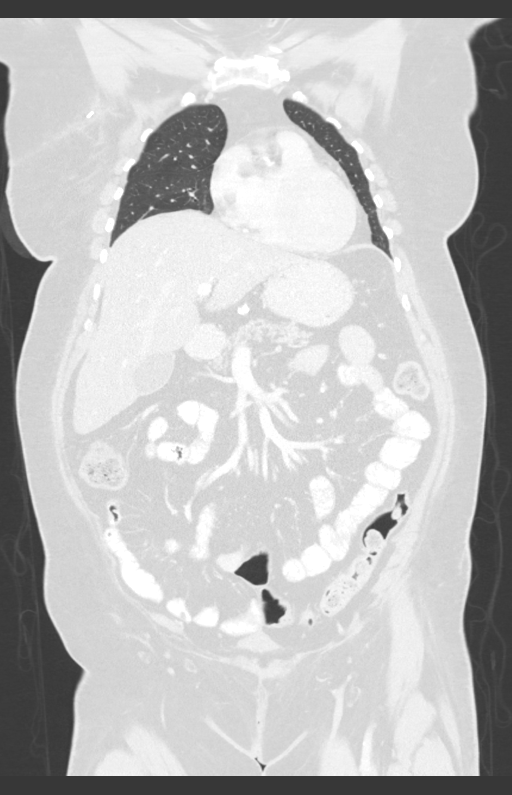
[im 86/143  lung]
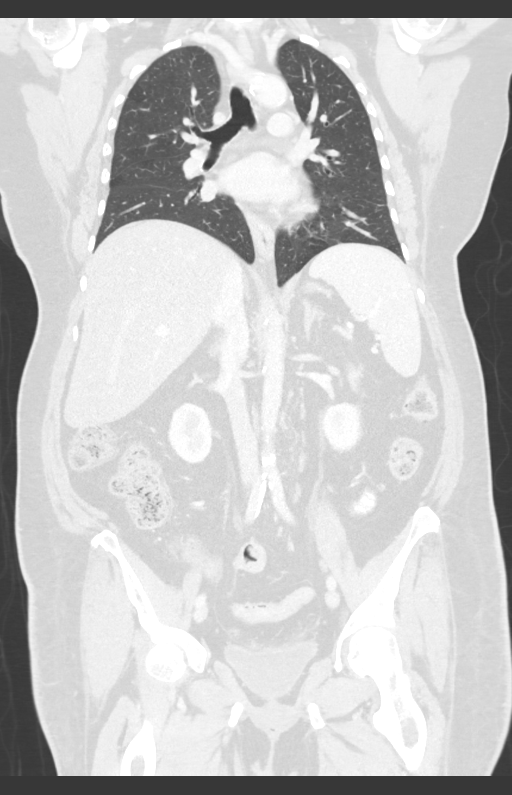

[13 of 36 positions shown; findings below may reference images not displayed]

FINDINGS: CT CHEST FINDINGS

Cardiovascular: The heart size is normal. No pericardial effusion.
Coronary artery calcification is noted. Atherosclerotic
calcification is noted in the wall of the thoracic aorta. Aberrant
origin of the right subclavian artery noted. Right Port-A-Cath tip
positioned mid right atrium

Mediastinum/Nodes: No mediastinal lymphadenopathy. There is no hilar
lymphadenopathy. The esophagus has normal imaging features.
Postsurgical changes noted right axilla.

Lungs/Pleura: 3 mm nodule identified right upper lobe (image 22
series 6). 4 mm lingular nodule identified on image 83 series 6
stable since prior abdomen CT. 2 mm left lower lobe pulmonary nodule
seen on image 97. 3 mm left upper lobe nodule seen on image 19. No
focal airspace consolidation. No pulmonary edema or pleural
effusion.

Musculoskeletal: Gas in the soft tissues around the port device is
compatible with placement earlier today. Skin thickening noted over
the right breast. Bone windows reveal no worrisome lytic or
sclerotic osseous lesions.

CT ABDOMEN PELVIS FINDINGS

Hepatobiliary: No focal abnormality within the liver parenchyma. The
liver shows diffusely decreased attenuation suggesting steatosis.
There is no evidence for gallstones, gallbladder wall thickening, or
pericholecystic fluid. No intrahepatic or extrahepatic biliary
dilation.

Pancreas: No focal mass lesion. No dilatation of the main duct. No
intraparenchymal cyst. No peripancreatic edema.

Spleen: No splenomegaly. No focal mass lesion.

Adrenals/Urinary Tract: No adrenal nodule or mass. Kidneys are
unremarkable. No evidence for hydroureter. The urinary bladder
appears normal for the degree of distention.

Stomach/Bowel: Tiny hiatal hernia noted. Stomach otherwise
unremarkable. Duodenum is normally positioned as is the ligament of
Treitz. No small bowel wall thickening. No small bowel dilatation.
The terminal ileum is normal. The appendix is not visualized, but
there is no edema or inflammation in the region of the cecum. No
gross colonic mass. No colonic wall thickening. No substantial
diverticular change.

Vascular/Lymphatic: There is abdominal aortic atherosclerosis
without aneurysm. Portal vein and superior mesenteric vein are
patent. There is no gastrohepatic or hepatoduodenal ligament
lymphadenopathy. No intraperitoneal or retroperitoneal
lymphadenopathy. No pelvic sidewall lymphadenopathy.

Reproductive: Uterus surgically absent.  There is no adnexal mass.

Other: No intraperitoneal free fluid.

Musculoskeletal: Pelvic floor laxity is evident. Bone windows reveal
no worrisome lytic or sclerotic osseous lesions.
IMPRESSION: 1. Scattered tiny pulmonary nodules, unlikely to represent
metastatic disease but attention on follow-up recommended.
2. No definite metastases identified in the chest, abdomen, or
pelvis.
3. Aberrant origin right subclavian artery noted incidentally.
4. Postsurgical changes right breast and right axilla.

## 2017-06-13 IMAGING — US IR US GUIDE VASC ACCESS LEFT
1 series · 1 of 1 positions shown · non-contrast
Comparison: None.

INDICATION: History of right-sided breast cancer, in need of durable intravenous
access for chemotherapy administration.

[Series 1: ir fluoro/shunt/fist · 1 of 1 slices shown]
[im 1/1]
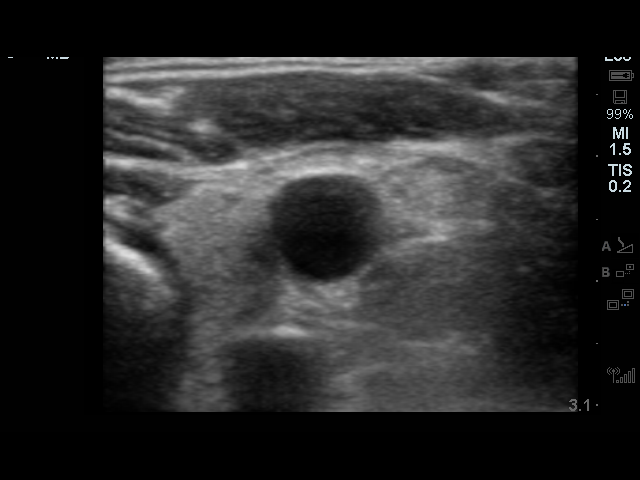

[1 of 1 positions shown; findings below may reference images not displayed]

Patient initially underwent attempted Port a catheter placement in
the operating room earlier today however difficulty was found
advancing the micro wire centrally. As such, the procedure was
aborted and patient presents now for ultrasound fluoroscopic guided
Port a catheter placement.

EXAM:
IMPLANTED PORT A CATH PLACEMENT WITH ULTRASOUND AND FLUOROSCOPIC
GUIDANCE
MEDICATIONS:
Vancomycin 1 gm IV; The antibiotic was administered within an
appropriate time interval prior to skin puncture.

ANESTHESIA/SEDATION:
Moderate (conscious) sedation was employed during this procedure. A
total of Versed 4 mg and Fentanyl 100 mcg was administered
intravenously.

Moderate Sedation Time: 41 minutes. The patient's level of
consciousness and vital signs were monitored continuously by
radiology nursing throughout the procedure under my direct
supervision.

CONTRAST:  20 cc [id number]

FLUOROSCOPY TIME:  2 minutes, 36 seconds (83.2 MGy)

COMPLICATIONS:
None immediate.

PROCEDURE:
The procedure, risks, benefits, and alternatives were explained to
the patient and the patient's husband. Questions regarding the
procedure were encouraged and answered. The patient and the
patient's husband understand and consents to the procedure.

Given the presence of a right-sided breast malignancy, decision was
made to attempt left internal jugular approach port a catheter
placement.

As such, the left neck and chest were prepped with chlorhexidine in
a sterile fashion, and a sterile drape was applied covering the
operative field. Maximum barrier sterile technique with sterile
gowns and gloves were used for the procedure. A timeout was
performed prior to the initiation of the procedure. Local anesthesia
was provided with 1% lidocaine with epinephrine.

After creating a small venotomy incision, a micropuncture kit was
utilized to access the internal jugular vein under direct, real-time
ultrasound guidance. Ultrasound image documentation was performed.
The microwire was kinked to measure appropriate catheter length.

Difficulty was noted advancing the micro wire centrally and as such,
a limited venogram was performed via the outer catheter the
micropuncture sheath demonstrating tortuosity but patency of the
left innominate vein and SVC.

A stiff Glidewire was advanced to the level of the IVC. A Kumpe the
catheter was utilized for measurement purposes.

A subcutaneous port pocket was then created along the upper chest
wall utilizing a combination of sharp and blunt dissection. The
pocket was irrigated with sterile saline. A single lumen ISP power
injectable port was chosen for placement. The 8 Fr catheter was
tunneled from the port pocket site to the venotomy incision. The
port was placed in the pocket. The external catheter was trimmed to
appropriate length. At the venotomy, an 8 Fr peel-away sheath was
placed over a guidewire under fluoroscopic guidance. The catheter
was then placed through the sheath and the sheath was removed. Final
catheter positioning was confirmed and documented with a
fluoroscopic spot radiograph. The port was accessed with Abimelk Tiger
needle, aspirated and flushed with heparinized saline.

The venotomy site was closed with an interrupted 4-0 Vicryl suture.
The port pocket incision was closed with interrupted 2-0 Vicryl
suture and the skin was opposed with a running subcuticular 4-0
Vicryl suture. Dermabond and Sambal were applied to both
incisions. Dressings were placed. The patient tolerated the
procedure well without immediate post procedural complication.
FINDINGS: After catheter placement, the tip lies within the superior
cavoatrial junction. The catheter aspirates and flushes normally and
is ready for immediate use.
IMPRESSION: Successful placement of a left internal jugular approach power
injectable Port-A-Cath. The catheter is ready for immediate use.

## 2017-09-05 DIAGNOSIS — Z170 Estrogen receptor positive status [ER+]: Secondary | ICD-10-CM | POA: Diagnosis not present

## 2017-09-05 DIAGNOSIS — I890 Lymphedema, not elsewhere classified: Secondary | ICD-10-CM | POA: Diagnosis not present

## 2017-09-05 DIAGNOSIS — C50911 Malignant neoplasm of unspecified site of right female breast: Secondary | ICD-10-CM | POA: Diagnosis not present

## 2017-09-05 DIAGNOSIS — Z923 Personal history of irradiation: Secondary | ICD-10-CM | POA: Diagnosis not present

## 2017-09-05 DIAGNOSIS — Z79811 Long term (current) use of aromatase inhibitors: Secondary | ICD-10-CM | POA: Diagnosis not present

## 2017-09-05 DIAGNOSIS — Z9221 Personal history of antineoplastic chemotherapy: Secondary | ICD-10-CM | POA: Diagnosis not present

## 2018-02-24 DIAGNOSIS — C50211 Malignant neoplasm of upper-inner quadrant of right female breast: Secondary | ICD-10-CM | POA: Diagnosis not present

## 2018-02-24 DIAGNOSIS — Z9221 Personal history of antineoplastic chemotherapy: Secondary | ICD-10-CM | POA: Diagnosis not present

## 2018-02-24 DIAGNOSIS — E538 Deficiency of other specified B group vitamins: Secondary | ICD-10-CM | POA: Diagnosis not present

## 2018-02-24 DIAGNOSIS — Z79811 Long term (current) use of aromatase inhibitors: Secondary | ICD-10-CM | POA: Diagnosis not present

## 2018-02-24 DIAGNOSIS — M797 Fibromyalgia: Secondary | ICD-10-CM | POA: Diagnosis not present

## 2018-02-24 DIAGNOSIS — Z923 Personal history of irradiation: Secondary | ICD-10-CM | POA: Diagnosis not present

## 2018-02-24 DIAGNOSIS — I890 Lymphedema, not elsewhere classified: Secondary | ICD-10-CM | POA: Diagnosis not present

## 2018-12-01 DIAGNOSIS — C50211 Malignant neoplasm of upper-inner quadrant of right female breast: Secondary | ICD-10-CM
